# Patient Record
Sex: Male | Born: 1975 | Race: White | Hispanic: No | Marital: Married | Smoking: Never smoker
Health system: Southern US, Community
[De-identification: ages and names within clinical notes are randomized; demographics above are authoritative.]

## PROBLEM LIST (undated history)

## (undated) DIAGNOSIS — T884XXA Failed or difficult intubation, initial encounter: Secondary | ICD-10-CM

## (undated) DIAGNOSIS — I10 Essential (primary) hypertension: Secondary | ICD-10-CM

## (undated) DIAGNOSIS — T7840XA Allergy, unspecified, initial encounter: Secondary | ICD-10-CM

## (undated) DIAGNOSIS — E669 Obesity, unspecified: Secondary | ICD-10-CM

## (undated) DIAGNOSIS — K219 Gastro-esophageal reflux disease without esophagitis: Secondary | ICD-10-CM

## (undated) HISTORY — PX: NOSE SURGERY: SHX723

## (undated) HISTORY — PX: OTHER SURGICAL HISTORY: SHX169

## (undated) HISTORY — DX: Obesity, unspecified: E66.9

## (undated) HISTORY — DX: Allergy, unspecified, initial encounter: T78.40XA

## (undated) HISTORY — DX: Essential (primary) hypertension: I10

## (undated) HISTORY — DX: Gastro-esophageal reflux disease without esophagitis: K21.9

---

## 1997-07-06 ENCOUNTER — Ambulatory Visit (HOSPITAL_BASED_OUTPATIENT_CLINIC_OR_DEPARTMENT_OTHER): Admission: RE | Admit: 1997-07-06 | Discharge: 1997-07-06 | Payer: Self-pay | Admitting: Orthopedic Surgery

## 1998-10-21 ENCOUNTER — Emergency Department (HOSPITAL_COMMUNITY): Admission: EM | Admit: 1998-10-21 | Discharge: 1998-10-21 | Payer: Self-pay | Admitting: Emergency Medicine

## 1998-10-28 ENCOUNTER — Encounter: Admission: RE | Admit: 1998-10-28 | Discharge: 1998-10-28 | Payer: Self-pay | Admitting: *Deleted

## 2002-12-16 ENCOUNTER — Emergency Department (HOSPITAL_COMMUNITY): Admission: EM | Admit: 2002-12-16 | Discharge: 2002-12-16 | Payer: Self-pay | Admitting: Emergency Medicine

## 2003-03-24 ENCOUNTER — Emergency Department (HOSPITAL_COMMUNITY): Admission: EM | Admit: 2003-03-24 | Discharge: 2003-03-24 | Payer: Self-pay | Admitting: Emergency Medicine

## 2004-12-14 ENCOUNTER — Ambulatory Visit: Payer: Self-pay | Admitting: Internal Medicine

## 2005-01-21 ENCOUNTER — Emergency Department (HOSPITAL_COMMUNITY): Admission: AD | Admit: 2005-01-21 | Discharge: 2005-01-21 | Payer: Self-pay | Admitting: Family Medicine

## 2005-06-21 ENCOUNTER — Ambulatory Visit: Payer: Self-pay | Admitting: Internal Medicine

## 2005-06-23 ENCOUNTER — Emergency Department (HOSPITAL_COMMUNITY): Admission: EM | Admit: 2005-06-23 | Discharge: 2005-06-23 | Payer: Self-pay | Admitting: Emergency Medicine

## 2005-06-26 ENCOUNTER — Ambulatory Visit: Payer: Self-pay | Admitting: Internal Medicine

## 2005-12-27 ENCOUNTER — Ambulatory Visit: Payer: Self-pay | Admitting: Internal Medicine

## 2006-01-24 ENCOUNTER — Ambulatory Visit: Payer: Self-pay | Admitting: Internal Medicine

## 2006-03-01 ENCOUNTER — Ambulatory Visit: Payer: Self-pay | Admitting: Internal Medicine

## 2006-04-01 ENCOUNTER — Ambulatory Visit: Payer: Self-pay | Admitting: Internal Medicine

## 2007-03-24 DIAGNOSIS — B029 Zoster without complications: Secondary | ICD-10-CM | POA: Insufficient documentation

## 2007-03-28 ENCOUNTER — Ambulatory Visit: Payer: Self-pay | Admitting: Family Medicine

## 2007-09-09 ENCOUNTER — Ambulatory Visit: Payer: Self-pay | Admitting: Internal Medicine

## 2007-09-09 DIAGNOSIS — M542 Cervicalgia: Secondary | ICD-10-CM

## 2007-09-16 ENCOUNTER — Ambulatory Visit: Payer: Self-pay | Admitting: Internal Medicine

## 2008-01-16 ENCOUNTER — Ambulatory Visit: Payer: Self-pay | Admitting: Family Medicine

## 2008-01-16 DIAGNOSIS — J019 Acute sinusitis, unspecified: Secondary | ICD-10-CM

## 2008-02-19 ENCOUNTER — Ambulatory Visit: Payer: Self-pay | Admitting: Internal Medicine

## 2008-02-19 DIAGNOSIS — E669 Obesity, unspecified: Secondary | ICD-10-CM

## 2008-02-19 DIAGNOSIS — IMO0002 Reserved for concepts with insufficient information to code with codable children: Secondary | ICD-10-CM | POA: Insufficient documentation

## 2008-06-01 ENCOUNTER — Emergency Department (HOSPITAL_COMMUNITY): Admission: EM | Admit: 2008-06-01 | Discharge: 2008-06-01 | Payer: Self-pay | Admitting: Emergency Medicine

## 2008-06-02 ENCOUNTER — Emergency Department (HOSPITAL_COMMUNITY): Admission: EM | Admit: 2008-06-02 | Discharge: 2008-06-02 | Payer: Self-pay | Admitting: Emergency Medicine

## 2008-11-12 ENCOUNTER — Telehealth: Payer: Self-pay | Admitting: Internal Medicine

## 2009-03-23 ENCOUNTER — Ambulatory Visit (HOSPITAL_COMMUNITY): Admission: RE | Admit: 2009-03-23 | Discharge: 2009-03-23 | Payer: Self-pay | Admitting: Otolaryngology

## 2010-04-06 ENCOUNTER — Other Ambulatory Visit: Payer: Self-pay | Admitting: Internal Medicine

## 2010-05-03 LAB — BASIC METABOLIC PANEL
BUN: 15 mg/dL (ref 6–23)
CO2: 26 mEq/L (ref 19–32)
Calcium: 9.5 mg/dL (ref 8.4–10.5)
Chloride: 98 mEq/L (ref 96–112)
Creatinine, Ser: 1.05 mg/dL (ref 0.4–1.5)
GFR calc Af Amer: >60 mL/min (ref >60)
GFR calc non Af Amer: >60 mL/min (ref >60)
Glucose, Bld: 98 mg/dL (ref 70–99)
Potassium: 3.7 mEq/L (ref 3.5–5.1)
Sodium: 135 mEq/L (ref 135–145)

## 2010-05-03 LAB — URINALYSIS, ROUTINE W REFLEX MICROSCOPIC
Bilirubin Urine: NEGATIVE
Glucose, UA: NEGATIVE mg/dL
Hgb urine dipstick: NEGATIVE
Ketones, ur: NEGATIVE mg/dL
Nitrite: NEGATIVE
Protein, ur: NEGATIVE mg/dL
Specific Gravity, Urine: 1.019 (ref 1.005–1.030)
Urobilinogen, UA: 0.2 mg/dL (ref 0.0–1.0)
pH: 6 (ref 5.0–8.0)

## 2010-05-03 LAB — CBC
HCT: 44.8 % (ref 39.0–52.0)
Hemoglobin: 15.4 g/dL (ref 13.0–17.0)
MCHC: 34.5 g/dL (ref 30.0–36.0)
MCV: 85.2 fL (ref 78.0–100.0)
Platelets: 206 10*3/uL (ref 150–400)
RBC: 5.26 MIL/uL (ref 4.22–5.81)
RDW: 12.6 % (ref 11.5–15.5)
WBC: 10.2 10*3/uL (ref 4.0–10.5)

## 2010-07-20 ENCOUNTER — Other Ambulatory Visit: Payer: Self-pay | Admitting: Family Medicine

## 2010-07-20 ENCOUNTER — Other Ambulatory Visit: Payer: Self-pay

## 2010-07-20 DIAGNOSIS — R319 Hematuria, unspecified: Secondary | ICD-10-CM

## 2010-07-20 DIAGNOSIS — R109 Unspecified abdominal pain: Secondary | ICD-10-CM

## 2010-07-21 ENCOUNTER — Other Ambulatory Visit: Payer: Self-pay

## 2010-08-04 ENCOUNTER — Ambulatory Visit (INDEPENDENT_AMBULATORY_CARE_PROVIDER_SITE_OTHER): Payer: Managed Care, Other (non HMO) | Admitting: Family Medicine

## 2010-08-04 ENCOUNTER — Encounter: Payer: Self-pay | Admitting: Family Medicine

## 2010-08-04 VITALS — BP 130/80 | HR 60 | Temp 99.2°F | Wt 350.0 lb

## 2010-08-04 DIAGNOSIS — J029 Acute pharyngitis, unspecified: Secondary | ICD-10-CM

## 2010-08-04 LAB — POCT RAPID STREP A (OFFICE): Rapid Strep A Screen: NEGATIVE

## 2010-08-04 MED ORDER — BENAZEPRIL HCL 20 MG PO TABS: 20.0000 mg | ORAL_TABLET | Freq: Every day | ORAL | Status: DC

## 2010-08-04 MED ORDER — CHLORTHALIDONE 25 MG PO TABS: 25.0000 mg | ORAL_TABLET | Freq: Every day | ORAL | Status: DC

## 2010-08-04 NOTE — Progress Notes (Signed)
  Subjective:    Patient ID: Justin White, male    DOB: 10/26/1975, 35 y.o.   MRN: [id number]  HPI Here for 3 days of body aches, ST, and fevers to 101 degrees. No cough or HA. No NVD. On fluids and Tylenol.    Review of Systems  Constitutional: Positive for fever.  HENT: Positive for sore throat. Negative for congestion, postnasal drip and sinus pressure.   Eyes: Negative.   Respiratory: Negative.   Gastrointestinal: Negative.   Genitourinary: Negative.        Objective:   Physical Exam  Constitutional: He appears well-developed and well-nourished.  HENT:  Right Ear: External ear normal.  Left Ear: External ear normal.  Nose: Nose normal.  Mouth/Throat: Oropharynx is clear and moist. No oropharyngeal exudate.  Eyes: Conjunctivae are normal. Pupils are equal, round, and reactive to light.  Neck: Normal range of motion. Neck supple. No thyromegaly present.  Pulmonary/Chest: Effort normal and breath sounds normal. No respiratory distress. He has no wheezes. He has no rales. He exhibits no tenderness.  Lymphadenopathy:    He has no cervical adenopathy.          Assessment & Plan:  This is a viral infection. Rest, drink fluids, use Advil prn .

## 2011-01-06 ENCOUNTER — Encounter (HOSPITAL_COMMUNITY): Payer: Self-pay | Admitting: Emergency Medicine

## 2011-01-06 ENCOUNTER — Emergency Department (HOSPITAL_COMMUNITY): Payer: Managed Care, Other (non HMO)

## 2011-01-06 ENCOUNTER — Emergency Department (HOSPITAL_COMMUNITY)
Admission: EM | Admit: 2011-01-06 | Discharge: 2011-01-06 | Disposition: A | Discharge status: Home / Self Care | Payer: Managed Care, Other (non HMO) | Attending: Emergency Medicine | Admitting: Emergency Medicine

## 2011-01-06 DIAGNOSIS — R070 Pain in throat: Secondary | ICD-10-CM | POA: Insufficient documentation

## 2011-01-06 DIAGNOSIS — R51 Headache: Secondary | ICD-10-CM | POA: Insufficient documentation

## 2011-01-06 DIAGNOSIS — R112 Nausea with vomiting, unspecified: Secondary | ICD-10-CM | POA: Insufficient documentation

## 2011-01-06 DIAGNOSIS — I10 Essential (primary) hypertension: Secondary | ICD-10-CM | POA: Insufficient documentation

## 2011-01-06 DIAGNOSIS — R059 Cough, unspecified: Secondary | ICD-10-CM | POA: Insufficient documentation

## 2011-01-06 DIAGNOSIS — R6883 Chills (without fever): Secondary | ICD-10-CM | POA: Insufficient documentation

## 2011-01-06 DIAGNOSIS — Z79899 Other long term (current) drug therapy: Secondary | ICD-10-CM | POA: Insufficient documentation

## 2011-01-06 DIAGNOSIS — IMO0001 Reserved for inherently not codable concepts without codable children: Secondary | ICD-10-CM | POA: Insufficient documentation

## 2011-01-06 DIAGNOSIS — R000 Tachycardia, unspecified: Secondary | ICD-10-CM | POA: Insufficient documentation

## 2011-01-06 DIAGNOSIS — J189 Pneumonia, unspecified organism: Secondary | ICD-10-CM | POA: Insufficient documentation

## 2011-01-06 DIAGNOSIS — R05 Cough: Secondary | ICD-10-CM | POA: Insufficient documentation

## 2011-01-06 DIAGNOSIS — R0602 Shortness of breath: Secondary | ICD-10-CM | POA: Insufficient documentation

## 2011-01-06 MED ORDER — MOXIFLOXACIN HCL 400 MG PO TABS: 400.0000 mg | ORAL_TABLET | Freq: Every day | ORAL | Status: DC

## 2011-01-06 MED ORDER — LIDOCAINE HCL (PF) 1 % IJ SOLN
INTRAMUSCULAR | Status: AC
  Administered 2011-01-06: 5 mL via INTRAMUSCULAR
  Filled 2011-01-06: qty 5

## 2011-01-06 MED ORDER — ONDANSETRON 4 MG PO TBDP
4.0000 mg | ORAL_TABLET | Freq: Once | ORAL | Status: AC
  Administered 2011-01-06: 4 mg via ORAL
  Filled 2011-01-06: qty 1

## 2011-01-06 MED ORDER — IPRATROPIUM BROMIDE 0.02 % IN SOLN
0.5000 mg | Freq: Once | RESPIRATORY_TRACT | Status: AC
  Administered 2011-01-06: 0.5 mg via RESPIRATORY_TRACT
  Filled 2011-01-06: qty 2.5

## 2011-01-06 MED ORDER — PROMETHAZINE-DM 6.25-15 MG/5ML PO SYRP: 5.0000 mL | ORAL_SOLUTION | Freq: Four times a day (QID) | ORAL | Status: AC | PRN

## 2011-01-06 MED ORDER — ALBUTEROL SULFATE (5 MG/ML) 0.5% IN NEBU
2.5000 mg | INHALATION_SOLUTION | Freq: Once | RESPIRATORY_TRACT | Status: AC
  Administered 2011-01-06: 2.5 mg via RESPIRATORY_TRACT
  Filled 2011-01-06 (×2): qty 0.5

## 2011-01-06 MED ORDER — CEFTRIAXONE SODIUM 1 G IJ SOLR
1.0000 g | Freq: Once | INTRAMUSCULAR | Status: AC
  Administered 2011-01-06: 1 g via INTRAMUSCULAR
  Filled 2011-01-06: qty 10

## 2011-01-06 MED ORDER — MOXIFLOXACIN HCL 400 MG PO TABS
400.0000 mg | ORAL_TABLET | Freq: Once | ORAL | Status: AC
  Administered 2011-01-06: 400 mg via ORAL
  Filled 2011-01-06: qty 1

## 2011-01-06 MED ORDER — ALBUTEROL SULFATE HFA 108 (90 BASE) MCG/ACT IN AERS: 1.0000 | INHALATION_SPRAY | Freq: Four times a day (QID) | RESPIRATORY_TRACT | Status: DC | PRN

## 2011-01-06 MED ORDER — PROMETHAZINE-CODEINE 6.25-10 MG/5ML PO SYRP
5.0000 mL | ORAL_SOLUTION | Freq: Once | ORAL | Status: AC
  Administered 2011-01-06: 5 mL via ORAL
  Filled 2011-01-06: qty 5

## 2011-01-06 NOTE — ED Provider Notes (Addendum)
History     CSN: [id number] Arrival date & time: 01/06/2011  3:14 AM   First MD Initiated Contact with Patient 01/06/11 [phone]      Chief Complaint  Patient presents with  . Cough    (Consider location/radiation/quality/duration/timing/severity/associated sxs/prior treatment) HPI Comments: Gradual onset of coughing, body aches, sore throat, headache.  Today, or tonight developed, nausea and vomiting.  No diarrhea  Patient is a 35 y.o. male presenting with cough. The history is provided by the patient.  Cough This is a new problem. The current episode started 2 days ago. The problem occurs every few minutes. The problem has been gradually worsening. The cough is non-productive. The maximum temperature recorded prior to his arrival was 100 to 100.9 F. Associated symptoms include chills, sore throat and shortness of breath. Pertinent negatives include no chest pain, no rhinorrhea and no wheezing. He has tried nothing for the symptoms. He is not a smoker.    Past Medical History  Diagnosis Date  . Hypertension   . Allergy   . Chickenpox   . Obesity   . Obesity     Past Surgical History  Procedure Date  . Left knee arthroscopy   . Left shoulder arthroscopy   . Nose surgery     Family History  Problem Relation Age of Onset  . Hypertension      History  Substance Use Topics  . Smoking status: Never Smoker   . Smokeless tobacco: Not on file  . Alcohol Use: No      Review of Systems  Constitutional: Positive for chills.  HENT: Positive for sore throat. Negative for rhinorrhea.   Respiratory: Positive for cough and shortness of breath. Negative for wheezing.   Cardiovascular: Negative for chest pain.  Gastrointestinal: Positive for nausea and vomiting. Negative for diarrhea.  Genitourinary: Negative.   Musculoskeletal: Negative.   Skin: Negative.   Neurological: Negative.   Hematological: Negative.   Psychiatric/Behavioral: Negative.     Allergies  Review of  patient's allergies indicates no known allergies.  Home Medications   Current Outpatient Rx  Name Route Sig Dispense Refill  . ALBUTEROL SULFATE HFA 108 (90 BASE) MCG/ACT IN AERS Inhalation Inhale 1-2 puffs into the lungs every 6 (six) hours as needed for wheezing. 1 Inhaler 0  . MOXIFLOXACIN HCL 400 MG PO TABS Oral Take 1 tablet (400 mg total) by mouth daily. 7 tablet 0  . PROMETHAZINE-DM 6.25-15 MG/5ML PO SYRP Oral Take 5 mLs by mouth 4 (four) times daily as needed for cough. 118 mL 0    BP 131/94  Pulse 111  Temp 98.2 F (36.8 C)  Resp 18  SpO2 96%  Physical Exam  Constitutional: He is oriented to person, place, and time. He appears well-developed and well-nourished.  HENT:  Head: Normocephalic.  Eyes: EOM are normal.  Neck: Neck supple.  Cardiovascular: Tachycardia present.   Pulmonary/Chest: Breath sounds normal. He has no wheezes.  Musculoskeletal: Normal range of motion.  Neurological: He is oriented to person, place, and time.  Skin: Skin is warm.  Psychiatric: He has a normal mood and affect.    ED Course  Procedures (including critical care time)  Labs Reviewed - No data to display Dg Chest 2 View  01/06/2011  *RADIOLOGY REPORT*  Clinical Data: Shortness of breath and painful cough; body aches.  CHEST - 2 VIEW  Comparison: Chest radiograph 03/17/2009  Findings: The lungs are relatively well-aerated.  Mild focal right midlung and left basilar airspace opacities raise  concern for multifocal pneumonia.  There is no evidence of pleural effusion or pneumothorax.  The heart is normal in size; the mediastinal contour is within normal limits.  No acute osseous abnormalities are seen.  IMPRESSION: Mild focal right midlung and left basilar airspace opacities raise concern for multifocal pneumonia.  Original Report Authenticated By: Tonia Ghent, M.D.     1. Pneumonia   6:43 AM With Xray showing pneumonia will give IM Rocephin and start on PO Avolox   MDM   pneumonia bronchitis        Arman Filter, NP 01/06/11 0431  Arman Filter, NP 01/06/11 0433  Arman Filter, NP 01/06/11 4540  Arman Filter, NP 01/06/11 9811  Arman Filter, NP 01/06/11 2032

## 2011-01-06 NOTE — ED Provider Notes (Signed)
Medical screening examination/treatment/procedure(s) were performed by non-physician practitioner and as supervising physician I was immediately available for consultation/collaboration.   Hanley Seamen, MD 01/06/11 [phone]

## 2011-01-06 NOTE — ED Notes (Signed)
PT STATED THAT HE COULD "BREATHE BETTER" AFTER NEB TX. DONE.

## 2011-01-06 NOTE — ED Notes (Signed)
PT UP W/O COUGHING OR SOB. NAD WIFE TO DRIVE.

## 2011-01-06 NOTE — ED Notes (Signed)
NO S/O ALLERGIC REACTION AFTER INJECTION. AWAITING DISPO. SITTING ON BS IN NRD.

## 2011-01-06 NOTE — ED Notes (Signed)
PT. REPORTS PERSISTENT DRY COUGH WITH BODY ACHES/PAIN AND LOW GRADE FEVER AND VOMITTING.

## 2011-01-08 ENCOUNTER — Encounter: Payer: Self-pay | Admitting: Internal Medicine

## 2011-01-08 ENCOUNTER — Ambulatory Visit (INDEPENDENT_AMBULATORY_CARE_PROVIDER_SITE_OTHER): Payer: Managed Care, Other (non HMO) | Admitting: Internal Medicine

## 2011-01-08 DIAGNOSIS — IMO0002 Reserved for concepts with insufficient information to code with codable children: Secondary | ICD-10-CM

## 2011-01-08 DIAGNOSIS — E669 Obesity, unspecified: Secondary | ICD-10-CM

## 2011-01-08 MED ORDER — CHLORTHALIDONE 25 MG PO TABS: 25.0000 mg | ORAL_TABLET | Freq: Every day | ORAL | Status: DC

## 2011-01-08 MED ORDER — BENAZEPRIL HCL 20 MG PO TABS: 20.0000 mg | ORAL_TABLET | Freq: Every day | ORAL | Status: DC

## 2011-01-08 NOTE — Patient Instructions (Signed)
Limit your sodium (Salt) intake  Please check your blood pressure on a regular basis.  If it is consistently greater than 150/90, please make an office appointment.    It is important that you exercise regularly, at least 20 minutes 3 to 4 times per week.  If you develop chest pain or shortness of breath seek  medical attention.  You need to lose weight.  Consider a lower calorie diet and regular exercise.  Take your antibiotic as prescribed until ALL of it is gone, but stop if you develop a rash, swelling, or any side effects of the medication.  Contact our office as soon as possible if  there are side effects of the medication.

## 2011-01-08 NOTE — Progress Notes (Signed)
  Subjective:    Patient ID: Justin White, male    DOB: 09/18/1975, 35 y.o.   MRN: [id number]  HPI  35 year old patient who is seen today for followup. He developed the cough fever and chills and was seen in the ED 3 days ago. Chest x-ray was worrisome for multilobar pneumonia. He is completing Avelox and has done much better. The fever and chills have resolved and the cough has improved. He has been out of work since this time. He has a long history of hypertension which has been controlled on Lotensin and chlorthalidone. He has been out of his medications for 2 weeks. Blood pressure is elevated in the ED and blood pressure on arrival here 140/80. Hospital records reviewed Chest x-ray reviewed    Review of Systems  Constitutional: Positive for fever, chills, diaphoresis, activity change, appetite change and fatigue.  HENT: Negative for hearing loss, ear pain, congestion, sore throat, trouble swallowing, neck stiffness, dental problem, voice change and tinnitus.   Eyes: Negative for pain, discharge and visual disturbance.  Respiratory: Positive for cough. Negative for chest tightness, wheezing and stridor.   Cardiovascular: Negative for chest pain, palpitations and leg swelling.  Gastrointestinal: Negative for nausea, vomiting, abdominal pain, diarrhea, constipation, blood in stool and abdominal distention.  Genitourinary: Negative for urgency, hematuria, flank pain, discharge, difficulty urinating and genital sores.  Musculoskeletal: Negative for myalgias, back pain, joint swelling, arthralgias and gait problem.  Skin: Negative for rash.  Neurological: Negative for dizziness, syncope, speech difficulty, weakness, numbness and headaches.  Hematological: Negative for adenopathy. Does not bruise/bleed easily.  Psychiatric/Behavioral: Negative for behavioral problems and dysphoric mood. The patient is not nervous/anxious.        Objective:   Physical Exam  Constitutional: He is oriented to  person, place, and time. He appears well-developed.       Weight in excess of 350 Blood pressure 120/80   HENT:  Head: Normocephalic.  Right Ear: External ear normal.  Left Ear: External ear normal.  Eyes: Conjunctivae and EOM are normal.  Neck: Normal range of motion.  Cardiovascular: Normal rate and normal heart sounds.   Pulmonary/Chest: Effort normal and breath sounds normal.       Rare scattered rhonchi. O2 saturation 97%  Abdominal: Bowel sounds are normal.  Musculoskeletal: Normal range of motion. He exhibits no edema and no tenderness.  Neurological: He is alert and oriented to person, place, and time.  Psychiatric: He has a normal mood and affect. His behavior is normal.          Assessment & Plan:   Resolving community acquired pneumonia. We'll complete 7 days of Avelox Hypertension. We'll resume blood pressure medications Exogenous obesity. Weight loss encouraged

## 2011-01-15 ENCOUNTER — Ambulatory Visit (INDEPENDENT_AMBULATORY_CARE_PROVIDER_SITE_OTHER): Payer: Managed Care, Other (non HMO) | Admitting: Internal Medicine

## 2011-01-15 ENCOUNTER — Encounter: Payer: Self-pay | Admitting: Internal Medicine

## 2011-01-15 ENCOUNTER — Ambulatory Visit (INDEPENDENT_AMBULATORY_CARE_PROVIDER_SITE_OTHER)
Admission: RE | Admit: 2011-01-15 | Discharge: 2011-01-15 | Disposition: A | Discharge status: Home / Self Care | Payer: Managed Care, Other (non HMO) | Source: Ambulatory Visit | Attending: Internal Medicine | Admitting: Internal Medicine

## 2011-01-15 DIAGNOSIS — J189 Pneumonia, unspecified organism: Secondary | ICD-10-CM

## 2011-01-15 DIAGNOSIS — IMO0002 Reserved for concepts with insufficient information to code with codable children: Secondary | ICD-10-CM

## 2011-01-15 MED ORDER — HYDROCODONE-HOMATROPINE 5-1.5 MG/5ML PO SYRP: 5.0000 mL | ORAL_SOLUTION | Freq: Four times a day (QID) | ORAL | Status: DC | PRN

## 2011-01-15 MED ORDER — AZITHROMYCIN 250 MG PO TABS: ORAL_TABLET | ORAL | Status: AC

## 2011-01-15 NOTE — Progress Notes (Signed)
  Subjective:    Patient ID: Justin White, male    DOB: 1975-09-22, 35 y.o.   MRN: [id number]  HPI  35 year old patient who has a history of obesity and treated hypertension. He was seen in the ED on November 24 and was treated for pneumonia with Avelox. He was seen here 2 days later. He still has considerable dyspnea on exertion as well as refractory nonproductive cough. He attempted to return to work today but was sent home due to the incessant cough he has had no more fever or chills and his cough is now nonproductive. He remains weak.    Review of Systems  Constitutional: Positive for fatigue. Negative for fever, chills and appetite change.  HENT: Negative for hearing loss, ear pain, congestion, sore throat, trouble swallowing, neck stiffness, dental problem, voice change and tinnitus.   Eyes: Negative for pain, discharge and visual disturbance.  Respiratory: Positive for cough (severe nonproductive cough) and shortness of breath (short of breath with minimal exertion). Negative for chest tightness, wheezing and stridor.   Cardiovascular: Negative for chest pain, palpitations and leg swelling.  Gastrointestinal: Negative for nausea, vomiting, abdominal pain, diarrhea, constipation, blood in stool and abdominal distention.  Genitourinary: Negative for urgency, hematuria, flank pain, discharge, difficulty urinating and genital sores.  Musculoskeletal: Negative for myalgias, back pain, joint swelling, arthralgias and gait problem.  Skin: Negative for rash.  Neurological: Negative for dizziness, syncope, speech difficulty, weakness, numbness and headaches.  Hematological: Negative for adenopathy. Does not bruise/bleed easily.  Psychiatric/Behavioral: Negative for behavioral problems and dysphoric mood. The patient is not nervous/anxious.        Objective:   Physical Exam  Constitutional: He is oriented to person, place, and time. He appears well-developed.       Obese The pressure 130/82   HENT:  Head: Normocephalic.  Right Ear: External ear normal.  Left Ear: External ear normal.  Eyes: Conjunctivae and EOM are normal.  Neck: Normal range of motion.  Cardiovascular: Normal rate and normal heart sounds.   Pulmonary/Chest:       Extensive rales on his left lower lung field O2 saturation 98 at rest and 97 after a walk up and down the hall Pulse rate increased from 75-102  Frequent paroxysms of coughing  Abdominal: Bowel sounds are normal.  Musculoskeletal: Normal range of motion. He exhibits no edema and no tenderness.  Neurological: He is alert and oriented to person, place, and time.  Psychiatric: He has a normal mood and affect. His behavior is normal.          Assessment & Plan:   Resolving pneumonia. Will retreat with  azithromycin and check a followup chest x-ray. We'll treat his cough symptomatically and add Mucinex. Return in 7 days for followup and hopefully we'll be able to return to work at that

## 2011-01-15 NOTE — Patient Instructions (Signed)
Drink as much fluid as you  can tolerate over the next few days  Mucinex one twice daily  Cough medicine as directed  . Return in 7 days

## 2011-01-22 ENCOUNTER — Ambulatory Visit (INDEPENDENT_AMBULATORY_CARE_PROVIDER_SITE_OTHER): Payer: Managed Care, Other (non HMO) | Admitting: Internal Medicine

## 2011-01-22 ENCOUNTER — Encounter: Payer: Self-pay | Admitting: Internal Medicine

## 2011-01-22 DIAGNOSIS — IMO0002 Reserved for concepts with insufficient information to code with codable children: Secondary | ICD-10-CM

## 2011-01-22 MED ORDER — HYDROCODONE-HOMATROPINE 5-1.5 MG/5ML PO SYRP: 5.0000 mL | ORAL_SOLUTION | Freq: Four times a day (QID) | ORAL | Status: AC | PRN

## 2011-01-22 NOTE — Progress Notes (Signed)
  Subjective:    Patient ID: Justin White, male    DOB: 1975/12/24, 35 y.o.   MRN: [id number]  HPI  35 year old patient who is seen today for followup. He was seen 7 days ago for followup of a community-acquired pneumonia he had completed the Avelox therapy in 7 days ago was retreated with azithromycin. Clinical exam revealed persistent rales involving the left lower lung field. A follow chest x-ray revealed resolution of his pneumonia. Denies any fever or chills but he has a persistent refractory incessant cough. Cough is nonproductive. He has been unable to work due to the cough and general debility. He has treated hypertension which has been stable.  He requires FMLA form completion    Review of Systems  Constitutional: Positive for fatigue. Negative for fever, chills and appetite change.  HENT: Negative for hearing loss, ear pain, congestion, sore throat, trouble swallowing, neck stiffness, dental problem, voice change and tinnitus.   Eyes: Negative for pain, discharge and visual disturbance.  Respiratory: Positive for cough, choking and wheezing. Negative for chest tightness and stridor.   Cardiovascular: Negative for chest pain, palpitations and leg swelling.  Gastrointestinal: Negative for nausea, vomiting, abdominal pain, diarrhea, constipation, blood in stool and abdominal distention.  Genitourinary: Negative for urgency, hematuria, flank pain, discharge, difficulty urinating and genital sores.  Musculoskeletal: Negative for myalgias, back pain, joint swelling, arthralgias and gait problem.  Skin: Negative for rash.  Neurological: Negative for dizziness, syncope, speech difficulty, weakness, numbness and headaches.  Hematological: Negative for adenopathy. Does not bruise/bleed easily.  Psychiatric/Behavioral: Negative for behavioral problems and dysphoric mood. The patient is not nervous/anxious.        Objective:   Physical Exam  Constitutional: He is oriented to person, place,  and time. He appears well-developed.       No distress. Afebrile. Frequent paroxysms of coughing  HENT:  Head: Normocephalic.  Right Ear: External ear normal.  Left Ear: External ear normal.  Eyes: Conjunctivae and EOM are normal.  Neck: Normal range of motion.  Cardiovascular: Normal rate and normal heart sounds.   Pulmonary/Chest: Effort normal. No respiratory distress. He has no wheezes. He has rales.       Scattered rales while the left lower lung field Improved compared to one week ago O2 saturation 98%  Abdominal: Bowel sounds are normal.  Musculoskeletal: Normal range of motion. He exhibits no edema and no tenderness.  Neurological: He is alert and oriented to person, place, and time.  Psychiatric: He has a normal mood and affect. His behavior is normal.          Assessment & Plan:   Slowly resolving community acquired pneumonia with refractory cough. He will continue antitussives and will be excused from work for another week. We'll continue hydrocodone-based cough medicine and Mucinex DM added to his regimen. He'll continue on inhalational albuterol. Hopefully,  will be able to return to work in 7 days

## 2011-01-22 NOTE — Patient Instructions (Addendum)
Hycodan 1 teaspoon every 12 hours  Get plenty of rest, Drink lots of  clear liquids, and use Tylenol or ibuprofen for fever and discomfort.    Call or return to clinic prn if these symptoms worsen or fail to improve as anticipated.  Mucinex  DM twice daily

## 2011-01-23 ENCOUNTER — Telehealth: Payer: Self-pay

## 2011-01-23 NOTE — Telephone Encounter (Signed)
Pt was seen on yesterday.  A note for work for yesterday and today.  Pt states a note to return to work on Dec. 17th is needed as well. Pls advise.

## 2011-01-23 NOTE — Telephone Encounter (Signed)
Spoke with pt- letter will be ready for pick up tomorrow

## 2011-07-11 ENCOUNTER — Ambulatory Visit: Payer: Managed Care, Other (non HMO) | Admitting: Internal Medicine

## 2011-08-13 ENCOUNTER — Encounter: Payer: Self-pay | Admitting: Internal Medicine

## 2011-08-13 ENCOUNTER — Telehealth: Payer: Self-pay | Admitting: Internal Medicine

## 2011-08-13 ENCOUNTER — Ambulatory Visit (INDEPENDENT_AMBULATORY_CARE_PROVIDER_SITE_OTHER): Payer: Managed Care, Other (non HMO) | Admitting: Internal Medicine

## 2011-08-13 VITALS — BP 156/94 | Temp 98.4°F | Wt 365.0 lb

## 2011-08-13 DIAGNOSIS — H109 Unspecified conjunctivitis: Secondary | ICD-10-CM | POA: Insufficient documentation

## 2011-08-13 MED ORDER — TOBRAMYCIN-DEXAMETHASONE 0.3-0.1 % OP OINT: TOPICAL_OINTMENT | Freq: Three times a day (TID) | OPHTHALMIC | Status: AC

## 2011-08-13 MED ORDER — NEOMYCIN-BACITRACIN ZN-POLYMYX 5-400-10000 OP OINT: TOPICAL_OINTMENT | Freq: Four times a day (QID) | OPHTHALMIC | Status: DC

## 2011-08-13 NOTE — Patient Instructions (Addendum)
Use zyrtec 10 mg once daily over the counter Use ranitidine 150 mg twice daily over the counter Please call our office if your symptoms do not improve or gets worse.

## 2011-08-13 NOTE — Telephone Encounter (Signed)
Caller: Justin White/Patient; PCP: Eleonore Chiquito; CB#: [phone];  Call regarding Eye Discharge to left eye; sx started 08/11/11; white of eyeball is red and itching; yellow discharge noted; no pain; little redness below the eye;  pt states that he had some Polymoxin eye drops that he started but they are not helping; pt is currently on PCN from having a tooth removed; Triaged per Eye: Infection or Irritation Guideline; See in 24 hr d/t new onset of eye redness, irritation with yellow drainage;  offered appt for tomorrow but pt would like to be seen today; appt made for 3:30pm today per office staff with Dr Artist Pais; pt will comply

## 2011-08-13 NOTE — Progress Notes (Signed)
  Subjective:    Patient ID: Justin White, male    DOB: 26-Feb-1975, 36 y.o.   MRN: [id number]  HPI  36 year old white male with hypertension and obesity complains of left eye redness for 2 days. His symptoms started on Friday night. He noticed his left eye was pleuritic and tearing profusely. Sunday morning he woke up to eye discharge. He denies any change in vision or eye pain. On Sunday he notes left eye was completely red but today redness has improved.  Review of Systems Negative for eye pain or changes in vision No fever He is currently on PCN after tooth extraction  Past Medical History  Diagnosis Date  . Hypertension   . Allergy   . Chickenpox   . Obesity   . Obesity     History   Social History  . Marital Status: Married    Spouse Name: N/A    Number of Children: N/A  . Years of Education: N/A   Occupational History  . Not on file.   Social History Main Topics  . Smoking status: Never Smoker   . Smokeless tobacco: Never Used  . Alcohol Use: No  . Drug Use: No  . Sexually Active: Not on file   Other Topics Concern  . Not on file   Social History Narrative   MarriedOccupation: [place] [employer]    Past Surgical History  Procedure Date  . Left knee arthroscopy   . Left shoulder arthroscopy   . Nose surgery     Family History  Problem Relation Age of Onset  . Hypertension      No Known Allergies  Current Outpatient Prescriptions on File Prior to Visit  Medication Sig Dispense Refill  . benazepril (LOTENSIN) 20 MG tablet Take 1 tablet (20 mg total) by mouth daily.  90 tablet  4  . chlorthalidone (HYGROTON) 25 MG tablet Take 1 tablet (25 mg total) by mouth daily.  90 tablet  4    BP 156/94  Temp 98.4 F (36.9 C) (Oral)  Wt 365 lb (165.563 kg)       Objective:   Physical Exam  Constitutional: He appears well-developed and well-nourished.  HENT:  Head: Normocephalic and atraumatic.  Right Ear: External ear normal.  Left Ear: External  ear normal.  Eyes: Pupils are equal, round, and reactive to light.       Left conjunctival injection No obvious foreign body Left lower eyelid slightly erythematous and swollen  Cardiovascular: Normal rate, regular rhythm and normal heart sounds.   Pulmonary/Chest: Breath sounds normal. He has no wheezes.      Assessment & Plan:

## 2011-08-13 NOTE — Assessment & Plan Note (Signed)
36 year old with conjunctivitis of left eye. Treat with dexamethasone and tobramycin ointment 4 times a day. Patient also to use eye patch for 3-5 days.  Zyrtec and ranitidine for pruritic symptoms.  Patient advised to call office if symptoms persist or worsen.

## 2012-03-07 ENCOUNTER — Encounter (HOSPITAL_COMMUNITY): Payer: Self-pay | Admitting: *Deleted

## 2012-03-07 ENCOUNTER — Emergency Department (HOSPITAL_COMMUNITY)
Admission: EM | Admit: 2012-03-07 | Discharge: 2012-03-07 | Disposition: A | Discharge status: Home / Self Care | Payer: Managed Care, Other (non HMO) | Attending: Emergency Medicine | Admitting: Emergency Medicine

## 2012-03-07 DIAGNOSIS — R1013 Epigastric pain: Secondary | ICD-10-CM | POA: Insufficient documentation

## 2012-03-07 DIAGNOSIS — R109 Unspecified abdominal pain: Secondary | ICD-10-CM

## 2012-03-07 DIAGNOSIS — R112 Nausea with vomiting, unspecified: Secondary | ICD-10-CM | POA: Insufficient documentation

## 2012-03-07 DIAGNOSIS — Z8619 Personal history of other infectious and parasitic diseases: Secondary | ICD-10-CM | POA: Insufficient documentation

## 2012-03-07 DIAGNOSIS — I10 Essential (primary) hypertension: Secondary | ICD-10-CM | POA: Insufficient documentation

## 2012-03-07 DIAGNOSIS — E669 Obesity, unspecified: Secondary | ICD-10-CM | POA: Insufficient documentation

## 2012-03-07 LAB — URINALYSIS, ROUTINE W REFLEX MICROSCOPIC
Nitrite: NEGATIVE
Specific Gravity, Urine: 1.024 (ref 1.005–1.030)
Urobilinogen, UA: 0.2 mg/dL (ref 0.0–1.0)

## 2012-03-07 LAB — COMPREHENSIVE METABOLIC PANEL
ALT: 26 U/L (ref 0–53)
AST: 22 U/L (ref 0–37)
Alkaline Phosphatase: 99 U/L (ref 39–117)
CO2: 24 mEq/L (ref 19–32)
GFR calc Af Amer: >90 mL/min (ref >90)
Glucose, Bld: 98 mg/dL (ref 70–99)
Potassium: 4 mEq/L (ref 3.5–5.1)
Sodium: 140 mEq/L (ref 135–145)
Total Protein: 8 g/dL (ref 6.0–8.3)

## 2012-03-07 LAB — CBC WITH DIFFERENTIAL/PLATELET
Basophils Absolute: 0 10*3/uL (ref 0.0–0.1)
Eosinophils Absolute: 0.2 10*3/uL (ref 0.0–0.7)
Lymphocytes Relative: 21 % (ref 12–46)
Lymphs Abs: 2.5 10*3/uL (ref 0.7–4.0)
Neutrophils Relative %: 70 % (ref 43–77)
Platelets: 201 10*3/uL (ref 150–400)
RBC: 5.32 MIL/uL (ref 4.22–5.81)
RDW: 13.3 % (ref 11.5–15.5)
WBC: 11.6 10*3/uL — ABNORMAL HIGH (ref 4.0–10.5)

## 2012-03-07 MED ORDER — ONDANSETRON HCL 4 MG/2ML IJ SOLN
4.0000 mg | Freq: Once | INTRAMUSCULAR | Status: AC
  Administered 2012-03-07: 4 mg via INTRAVENOUS
  Filled 2012-03-07: qty 2

## 2012-03-07 MED ORDER — GI COCKTAIL ~~LOC~~
30.0000 mL | Freq: Once | ORAL | Status: AC
  Administered 2012-03-07: 30 mL via ORAL
  Filled 2012-03-07: qty 30

## 2012-03-07 MED ORDER — SODIUM CHLORIDE 0.9 % IV BOLUS (SEPSIS)
1000.0000 mL | Freq: Once | INTRAVENOUS | Status: AC
  Administered 2012-03-07: 1000 mL via INTRAVENOUS

## 2012-03-07 MED ORDER — SUCRALFATE 1 GM/10ML PO SUSP: 1.0000 g | Freq: Four times a day (QID) | ORAL | Status: DC | PRN

## 2012-03-07 MED ORDER — HYDROMORPHONE HCL PF 1 MG/ML IJ SOLN
0.5000 mg | INTRAMUSCULAR | Status: DC | PRN
  Administered 2012-03-07: 0.5 mg via INTRAVENOUS
  Filled 2012-03-07: qty 1

## 2012-03-07 MED ORDER — HYDROMORPHONE HCL PF 1 MG/ML IJ SOLN
1.0000 mg | Freq: Once | INTRAMUSCULAR | Status: AC
  Administered 2012-03-07: 1 mg via INTRAVENOUS
  Filled 2012-03-07: qty 1

## 2012-03-07 MED ORDER — OMEPRAZOLE 20 MG PO CPDR: 20.0000 mg | DELAYED_RELEASE_CAPSULE | Freq: Every day | ORAL | Status: DC

## 2012-03-07 NOTE — ED Provider Notes (Signed)
History     CSN: [id number]  Arrival date & time 03/07/12  8295   First MD Initiated Contact with Patient 03/07/12 1939      Chief Complaint  Patient presents with  . Abdominal Pain    (Consider location/radiation/quality/duration/timing/severity/associated sxs/prior treatment) HPI The patient presents after the subacute onset of abdominal pain.  The pain began approximately 8 hours prior to arrival.  Since onset has been severe pain in the patient's epigastrium, nonradiating, minimally improved with TUMS.  There is associated nausea, and multiple episodes of emesis.  No diarrhea, or bowel movement changes. No fever, no chills, no chest pain, no dyspnea, no lightheadedness, no syncope. The patient states that he is one similar prior episode in the distant past, though not as severe. The patient also notes a history of kidney stones.   Past Medical History  Diagnosis Date  . Hypertension   . Allergy   . Chickenpox   . Obesity   . Obesity     Past Surgical History  Procedure Date  . Left knee arthroscopy   . Left shoulder arthroscopy   . Nose surgery     Family History  Problem Relation Age of Onset  . Hypertension      History  Substance Use Topics  . Smoking status: Never Smoker   . Smokeless tobacco: Never Used  . Alcohol Use: No      Review of Systems  Constitutional:       Per HPI, otherwise negative  HENT:       Per HPI, otherwise negative  Eyes: Negative.   Respiratory:       Per HPI, otherwise negative  Cardiovascular:       Per HPI, otherwise negative  Gastrointestinal: Positive for nausea and vomiting.  Genitourinary: Negative.   Musculoskeletal:       Per HPI, otherwise negative  Skin: Negative.   Neurological: Negative for syncope.    Allergies  Review of patient's allergies indicates no known allergies.  Home Medications   Current Outpatient Rx  Name  Route  Sig  Dispense  Refill  . ACETAMINOPHEN 500 MG PO TABS   Oral   Take  500 mg by mouth every 6 (six) hours as needed. For pain         . ALKA-SELTZER PLUS COLD/SINUS PO   Oral   Take 2 capsules by mouth every 4 (four) hours as needed. For cold/cough           BP 166/76  Pulse 67  Temp 97.5 F (36.4 C) (Oral)  Resp 22  SpO2 100%  Physical Exam  Nursing note and vitals reviewed. Constitutional: He is oriented to person, place, and time. He appears well-developed. No distress.  HENT:  Head: Normocephalic and atraumatic.  Eyes: Conjunctivae normal and EOM are normal.  Cardiovascular: Normal rate and regular rhythm.   Pulmonary/Chest: Effort normal. No stridor. No respiratory distress.  Abdominal: He exhibits no distension. There is no hepatosplenomegaly. There is tenderness in the epigastric area. There is guarding. There is no rigidity and no rebound.  Musculoskeletal: He exhibits no edema.  Neurological: He is alert and oriented to person, place, and time.  Skin: Skin is warm and dry.  Psychiatric: He has a normal mood and affect.    ED Course  Procedures (including critical care time)  Labs Reviewed  CBC WITH DIFFERENTIAL - Abnormal; Notable for the following:    WBC 11.6 (*)     Neutro Abs 8.2 (*)  All other components within normal limits  URINALYSIS, ROUTINE W REFLEX MICROSCOPIC  COMPREHENSIVE METABOLIC PANEL  LIPASE, BLOOD   No results found.   No diagnosis found.  Update: Following provision of GI cocktail, fluids, the patient notes some improvement in his condition.    Date: 03/07/2012  Rate:62  Rhythm: normal sinus rhythm  QRS Axis: right  Intervals: normal  ST/T Wave abnormalities: normal  Conduction Disutrbances:right bundle branch block  Narrative Interpretation:   Old EKG Reviewed: none available ABNORMAL - non-ischemic  9:23 PM Patient in severe pain  2310: Patient substantially better.  No new complaint MDM  This generally well 37 year old male now presents after the subacute onset of abdominal pain.   The patient's description of epigastric pain, vomiting, nausea is suggestive of gastric or esophageal irritation.  Absent right upper quadrant pain, and with the generally soft abdomen, there is low suspicion for acute other GI pathology.  The patient has minimal cardiac risk factors, does not complain of chest pain or dyspnea, and there is low suspicion for ACS.  The improvement following GI cocktail is reassuring.  The patient's labs are largely unremarkable.  Following a period of observation, the patient was discharged in stable condition to     Gerhard Munch, MD 03/08/12 [phone]

## 2012-03-07 NOTE — ED Notes (Signed)
Pt dc to home.  Pt states understanding to dc paperwork.  Pt ambulatory to exit without difficulty.  Pt denies need for w/c. 

## 2012-03-07 NOTE — ED Notes (Signed)
Pt states feeling better.

## 2012-03-07 NOTE — ED Notes (Signed)
The pt has had epigastric pain with nv since  1200n today after he ate at [employer].  No previous history.  C/o severe pain at present

## 2012-03-07 NOTE — ED Notes (Signed)
The pt has been attempting to loose and has been calorie counting and has lost over 50 lbc.  He now reports that he had pain in nov after eating a rich meal

## 2014-08-25 ENCOUNTER — Other Ambulatory Visit: Payer: Self-pay | Admitting: Gastroenterology

## 2014-08-25 DIAGNOSIS — R1013 Epigastric pain: Secondary | ICD-10-CM

## 2014-09-01 ENCOUNTER — Ambulatory Visit
Admission: RE | Admit: 2014-09-01 | Discharge: 2014-09-01 | Disposition: A | Discharge status: Home / Self Care | Payer: 59 | Source: Ambulatory Visit | Attending: Gastroenterology | Admitting: Gastroenterology

## 2014-09-01 DIAGNOSIS — R1013 Epigastric pain: Secondary | ICD-10-CM

## 2014-09-04 ENCOUNTER — Emergency Department (HOSPITAL_COMMUNITY)
Admission: EM | Admit: 2014-09-04 | Discharge: 2014-09-04 | Disposition: A | Discharge status: Home / Self Care | Payer: 59 | Attending: Emergency Medicine | Admitting: Emergency Medicine

## 2014-09-04 ENCOUNTER — Encounter (HOSPITAL_COMMUNITY): Payer: Self-pay | Admitting: Emergency Medicine

## 2014-09-04 DIAGNOSIS — K8020 Calculus of gallbladder without cholecystitis without obstruction: Secondary | ICD-10-CM | POA: Diagnosis not present

## 2014-09-04 DIAGNOSIS — Z8619 Personal history of other infectious and parasitic diseases: Secondary | ICD-10-CM | POA: Insufficient documentation

## 2014-09-04 DIAGNOSIS — I10 Essential (primary) hypertension: Secondary | ICD-10-CM | POA: Insufficient documentation

## 2014-09-04 DIAGNOSIS — Z79899 Other long term (current) drug therapy: Secondary | ICD-10-CM | POA: Insufficient documentation

## 2014-09-04 DIAGNOSIS — R1010 Upper abdominal pain, unspecified: Secondary | ICD-10-CM | POA: Diagnosis present

## 2014-09-04 LAB — CBC WITH DIFFERENTIAL/PLATELET
BASOS PCT: 1 % (ref 0–1)
Basophils Absolute: 0.1 10*3/uL (ref 0.0–0.1)
EOS ABS: 0.3 10*3/uL (ref 0.0–0.7)
EOS PCT: 4 % (ref 0–5)
HEMATOCRIT: 43.6 % (ref 39.0–52.0)
Hemoglobin: 14.5 g/dL (ref 13.0–17.0)
Lymphocytes Relative: 28 % (ref 12–46)
Lymphs Abs: 2.2 10*3/uL (ref 0.7–4.0)
MCH: 26.9 pg (ref 26.0–34.0)
MCHC: 33.3 g/dL (ref 30.0–36.0)
MCV: 80.7 fL (ref 78.0–100.0)
Monocytes Absolute: 0.5 10*3/uL (ref 0.1–1.0)
Monocytes Relative: 6 % (ref 3–12)
NEUTROS PCT: 61 % (ref 43–77)
Neutro Abs: 4.7 10*3/uL (ref 1.7–7.7)
PLATELETS: 187 10*3/uL (ref 150–400)
RBC: 5.4 MIL/uL (ref 4.22–5.81)
RDW: 13.3 % (ref 11.5–15.5)
WBC: 7.7 10*3/uL (ref 4.0–10.5)

## 2014-09-04 LAB — COMPREHENSIVE METABOLIC PANEL
ALBUMIN: 4 g/dL (ref 3.5–5.0)
ALK PHOS: 105 U/L (ref 38–126)
ALT: 26 U/L (ref 17–63)
AST: 20 U/L (ref 15–41)
Anion gap: 7 (ref 5–15)
BILIRUBIN TOTAL: 0.7 mg/dL (ref 0.3–1.2)
BUN: 12 mg/dL (ref 6–20)
CHLORIDE: 106 mmol/L (ref 101–111)
CO2: 27 mmol/L (ref 22–32)
CREATININE: 1.02 mg/dL (ref 0.61–1.24)
Calcium: 8.8 mg/dL — ABNORMAL LOW (ref 8.9–10.3)
GLUCOSE: 95 mg/dL (ref 65–99)
POTASSIUM: 4.1 mmol/L (ref 3.5–5.1)
Sodium: 140 mmol/L (ref 135–145)
Total Protein: 7.6 g/dL (ref 6.5–8.1)

## 2014-09-04 LAB — LIPASE, BLOOD: LIPASE: 33 U/L (ref 22–51)

## 2014-09-04 MED ORDER — MORPHINE SULFATE 4 MG/ML IJ SOLN
4.0000 mg | Freq: Once | INTRAMUSCULAR | Status: AC
  Administered 2014-09-04: 4 mg via INTRAVENOUS
  Filled 2014-09-04: qty 1

## 2014-09-04 MED ORDER — HYDROCODONE-ACETAMINOPHEN 5-325 MG PO TABS: 1.0000 | ORAL_TABLET | ORAL | Status: DC | PRN

## 2014-09-04 NOTE — Discharge Instructions (Signed)
Biliary Colic  °Biliary colic is a steady or irregular pain in the upper abdomen. It is usually under the right side of the rib cage. It happens when gallstones interfere with the normal flow of bile from the gallbladder. Bile is a liquid that helps to digest fats. Bile is made in the liver and stored in the gallbladder. When you eat a meal, bile passes from the gallbladder through the cystic duct and the common bile duct into the small intestine. There, it mixes with partially digested food. If a gallstone blocks either of these ducts, the normal flow of bile is blocked. The muscle cells in the bile duct contract forcefully to try to move the stone. This causes the pain of biliary colic.  °SYMPTOMS  °· A person with biliary colic usually complains of pain in the upper abdomen. This pain can be: °¨ In the center of the upper abdomen just below the breastbone. °¨ In the upper-right part of the abdomen, near the gallbladder and liver. °¨ Spread back toward the right shoulder blade. °· Nausea and vomiting. °· The pain usually occurs after eating. °· Biliary colic is usually triggered by the digestive system's demand for bile. The demand for bile is high after fatty meals. Symptoms can also occur when a person who has been fasting suddenly eats a very large meal. Most episodes of biliary colic pass after 1 to 5 hours. After the most intense pain passes, your abdomen may continue to ache mildly for about 24 hours. °DIAGNOSIS  °After you describe your symptoms, your caregiver will perform a physical exam. He or she will pay attention to the upper right portion of your belly (abdomen). This is the area of your liver and gallbladder. An ultrasound will help your caregiver look for gallstones. Specialized scans of the gallbladder may also be done. Blood tests may be done, especially if you have fever or if your pain persists. °PREVENTION  °Biliary colic can be prevented by controlling the risk factors for gallstones. Some of  these risk factors, such as heredity, increasing age, and pregnancy are a normal part of life. Obesity and a high-fat diet are risk factors you can change through a healthy lifestyle. Women going through menopause who take hormone replacement therapy (estrogen) are also more likely to develop biliary colic. °TREATMENT  °· Pain medication may be prescribed. °· You may be encouraged to eat a fat-free diet. °· If the first episode of biliary colic is severe, or episodes of colic keep retuning, surgery to remove the gallbladder (cholecystectomy) is usually recommended. This procedure can be done through small incisions using an instrument called a laparoscope. The procedure often requires a brief stay in the hospital. Some people can leave the hospital the same day. It is the most widely used treatment in people troubled by painful gallstones. It is effective and safe, with no complications in more than 90% of cases. °· If surgery cannot be done, medication that dissolves gallstones may be used. This medication is expensive and can take months or years to work. Only small stones will dissolve. °· Rarely, medication to dissolve gallstones is combined with a procedure called shock-wave lithotripsy. This procedure uses carefully aimed shock waves to break up gallstones. In many people treated with this procedure, gallstones form again within a few years. °PROGNOSIS  °If gallstones block your cystic duct or common bile duct, you are at risk for repeated episodes of biliary colic. There is also a 25% chance that you will develop   a gallbladder infection(acute cholecystitis), or some other complication of gallstones within 10 to 20 years. If you have surgery, schedule it at a time that is convenient for you and at a time when you are not sick. °HOME CARE INSTRUCTIONS  °· Drink plenty of clear fluids. °· Avoid fatty, greasy or fried foods, or any foods that make your pain worse. °· Take medications as directed. °SEEK MEDICAL  CARE IF:  °· You develop a fever over 100.5° F (38.1° C). °· Your pain gets worse over time. °· You develop nausea that prevents you from eating and drinking. °· You develop vomiting. °SEEK IMMEDIATE MEDICAL CARE IF:  °· You have continuous or severe belly (abdominal) pain which is not relieved with medications. °· You develop nausea and vomiting which is not relieved with medications. °· You have symptoms of biliary colic and you suddenly develop a fever and shaking chills. This may signal cholecystitis. Call your caregiver immediately. °· You develop a yellow color to your skin or the white part of your eyes (jaundice). °Document Released: 07/02/2005 Document Revised: 04/23/2011 Document Reviewed: 09/11/2007 °[employer]® Patient Information ©2015 [employer]. This information is not intended to replace advice given to you by your health care provider. Make sure you discuss any questions you have with your health care provider. ° ° °Low-Fat Diet for Pancreatitis or Gallbladder Conditions °A low-fat diet can be helpful if you have pancreatitis or a gallbladder condition. With these conditions, your pancreas and gallbladder have trouble digesting fats. A healthy eating plan with less fat will help rest your pancreas and gallbladder and reduce your symptoms. °WHAT DO I NEED TO KNOW ABOUT THIS DIET? °· Eat a low-fat diet. °¨ Reduce your fat intake to less than 20-30% of your total daily calories. This is less than 50-60 g of fat per day. °¨ Remember that you need some fat in your diet. Ask your dietician what your daily goal should be. °¨ Choose nonfat and low-fat healthy foods. Look for the words "nonfat," "low fat," or "fat free." °¨ As a guide, look on the label and choose foods with less than 3 g of fat per serving. Eat only one serving. °· Avoid alcohol. °· Do not smoke. If you need help quitting, talk with your health care provider. °· Eat small frequent meals instead of three large heavy meals. °WHAT FOODS CAN I  EAT? °Grains °Include healthy grains and starches such as potatoes, wheat bread, fiber-rich cereal, and brown rice. Choose whole grain options whenever possible. In adults, whole grains should account for 45-65% of your daily calories.  °Fruits and Vegetables °Eat plenty of fruits and vegetables. Fresh fruits and vegetables add fiber to your diet. °Meats and Other Protein Sources °Eat lean meat such as chicken and pork. Trim any fat off of meat before cooking it. Eggs, fish, and beans are other sources of protein. In adults, these foods should account for 10-35% of your daily calories. °Dairy °Choose low-fat milk and dairy options. Dairy includes fat and protein, as well as calcium.  °Fats and Oils °Limit high-fat foods such as fried foods, sweets, baked goods, sugary drinks.  °Other °Creamy sauces and condiments, such as mayonnaise, can add extra fat. Think about whether or not you need to use them, or use smaller amounts or low fat options. °WHAT FOODS ARE NOT RECOMMENDED? °· High fat foods, such as: °¨ Baked goods. °¨ Ice cream. °¨ French toast. °¨ Sweet rolls. °¨ Pizza. °¨ Cheese bread. °¨ Foods covered with   batter, butter, creamy sauces, or cheese. °¨ Fried foods. °¨ Sugary drinks and desserts. °· Foods that cause gas or bloating °Document Released: 02/03/2013 Document Reviewed: 02/03/2013 °Exit[employer]® Patient Information ©2015 [employer]. This information is not intended to replace advice given to you by your health care provider. Make sure you discuss any questions you have with your health care provider. ° °

## 2014-09-04 NOTE — ED Notes (Signed)
Pt states that he has been having abd pain and has been seeing dr. Benson Norway.  States that he had an [place] on Wednesday that showed gallstones and sludge.  Has an appt with surgery next Thursday but was told to come to the ER if he had any more pain.  States he has been hurting since the [place].

## 2014-09-04 NOTE — ED Provider Notes (Signed)
CSN: [id number]     Arrival date & time 09/04/14  1016 History   First MD Initiated Contact with Patient 09/04/14 1115     Chief Complaint  Patient presents with  . Abdominal Pain     (Consider location/radiation/quality/duration/timing/severity/associated sxs/prior Treatment) HPI Comments: Patient presents with abdominal pain. He has been seeing Dr. Benson Norway a week and half ago he saw him with upper abdominal pain, primarily in the epigastric region. Dr. Benson Norway felt that he had gallbladder disease and sent him for an ultrasound which did show gallstones which she felt was a cause of the patient's pain. He's had some ongoing symptoms since that time with episodes of increased pain and nausea. This recent episode was during the night last night. He currently does have some ongoing pain to his epigastrium and some nausea but no vomiting. He denies any fevers. He's been taking Zofran at home with some improvement of symptoms. He has upcoming appointment to see general surgery on Thursday but he states his pain got worse so he came in to be evaluated.  Patient is a 39 y.o. male presenting with abdominal pain.  Abdominal Pain Associated symptoms: nausea   Associated symptoms: no chest pain, no chills, no cough, no diarrhea, no fatigue, no fever, no hematuria, no shortness of breath and no vomiting     Past Medical History  Diagnosis Date  . Hypertension   . Allergy   . Chickenpox   . Obesity   . Obesity    Past Surgical History  Procedure Laterality Date  . Left knee arthroscopy    . Left shoulder arthroscopy    . Nose surgery     Family History  Problem Relation Age of Onset  . Hypertension     History  Substance Use Topics  . Smoking status: Never Smoker   . Smokeless tobacco: Never Used  . Alcohol Use: No    Review of Systems  Constitutional: Negative for fever, chills, diaphoresis and fatigue.  HENT: Negative for congestion, rhinorrhea and sneezing.   Eyes: Negative.    Respiratory: Negative for cough, chest tightness and shortness of breath.   Cardiovascular: Negative for chest pain and leg swelling.  Gastrointestinal: Positive for nausea and abdominal pain. Negative for vomiting, diarrhea and blood in stool.  Genitourinary: Negative for frequency, hematuria, flank pain and difficulty urinating.  Musculoskeletal: Negative for back pain and arthralgias.  Skin: Negative for rash.  Neurological: Negative for dizziness, speech difficulty, weakness, numbness and headaches.      Allergies  Review of patient's allergies indicates no known allergies.  Home Medications   Prior to Admission medications   Medication Sig Start Date End Date Taking? Authorizing Provider  acetaminophen (TYLENOL) 500 MG tablet Take 500 mg by mouth every 6 (six) hours as needed. For pain   Yes Historical Provider, MD  lansoprazole (PREVACID) 15 MG capsule Take 15 mg by mouth daily at 12 noon.   Yes Historical Provider, MD  omeprazole (PRILOSEC) 20 MG capsule Take 1 capsule (20 mg total) by mouth daily. Patient not taking: Reported on 09/04/2014 03/07/12   Carmin Muskrat, MD  sucralfate (CARAFATE) 1 GM/10ML suspension Take 10 mLs (1 g total) by mouth every 6 (six) hours as needed. Patient not taking: Reported on 09/04/2014 03/07/12   Carmin Muskrat, MD   BP 190/100 mmHg  Pulse 60  Temp(Src) 98.7 F (37.1 C) (Oral)  Resp 18  SpO2 99% Physical Exam  Constitutional: He is oriented to person, place, and time. He  appears well-developed and well-nourished.  Morbidly obese  HENT:  Head: Normocephalic and atraumatic.  Eyes: Pupils are equal, round, and reactive to light.  Neck: Normal range of motion. Neck supple.  Cardiovascular: Normal rate, regular rhythm and normal heart sounds.   Pulmonary/Chest: Effort normal and breath sounds normal. No respiratory distress. He has no wheezes. He has no rales. He exhibits no tenderness.  Abdominal: Soft. Bowel sounds are normal. There is  tenderness (Tenderness to the epigastrium and across upper abdomen). There is no rebound and no guarding.  Musculoskeletal: Normal range of motion. He exhibits no edema.  Lymphadenopathy:    He has no cervical adenopathy.  Neurological: He is alert and oriented to person, place, and time.  Skin: Skin is warm and dry. No rash noted.  Psychiatric: He has a normal mood and affect.    ED Course  Procedures (including critical care time) Labs Review Labs Reviewed  COMPREHENSIVE METABOLIC PANEL - Abnormal; Notable for the following:    Calcium 8.8 (*)    All other components within normal limits  CBC WITH DIFFERENTIAL/PLATELET  LIPASE, BLOOD    Imaging Review No results found.   EKG Interpretation None      MDM   Final diagnoses:  Calculus of gallbladder without cholecystitis without obstruction    Pt with known gallstones diagnosed week and half ago. There is no evidence of cholecystitis. No fever. No elevated white count. No evidence of obstruction. His pain is controlled in the ED. He has no vomiting. I spoke with Dr. Zella Richer who advises that patient can be discharged home. I did advise the patient on a clear liquid diet for the next 48 hours and then after that a nonfat diet. I discussed this with the patient. Dr. Zella Richer does state that if patient has to come back to the ER a second time then the surgery team will go ahead and admit him at that time to have his gallbladder removed. Otherwise he can follow-up with [facility] surgery on Thursday as previously scheduled. Patient was advised to return if he has worsening pain vomiting or fevers. He was given prescription for Vicodin. He states he has Zofran at home to use.    Malvin Johns, MD 09/04/14 1251

## 2014-09-07 ENCOUNTER — Encounter (HOSPITAL_COMMUNITY): Payer: Self-pay

## 2014-09-07 ENCOUNTER — Observation Stay (HOSPITAL_COMMUNITY)
Admission: EM | Admit: 2014-09-07 | Discharge: 2014-09-09 | Disposition: A | Discharge status: Home / Self Care | Payer: 59 | Attending: General Surgery | Admitting: General Surgery

## 2014-09-07 DIAGNOSIS — Z6841 Body Mass Index (BMI) 40.0 and over, adult: Secondary | ICD-10-CM | POA: Insufficient documentation

## 2014-09-07 DIAGNOSIS — Z79899 Other long term (current) drug therapy: Secondary | ICD-10-CM | POA: Insufficient documentation

## 2014-09-07 DIAGNOSIS — K8010 Calculus of gallbladder with chronic cholecystitis without obstruction: Secondary | ICD-10-CM | POA: Diagnosis not present

## 2014-09-07 DIAGNOSIS — I10 Essential (primary) hypertension: Secondary | ICD-10-CM | POA: Diagnosis not present

## 2014-09-07 DIAGNOSIS — K8020 Calculus of gallbladder without cholecystitis without obstruction: Secondary | ICD-10-CM | POA: Diagnosis present

## 2014-09-07 DIAGNOSIS — Z419 Encounter for procedure for purposes other than remedying health state, unspecified: Secondary | ICD-10-CM

## 2014-09-07 DIAGNOSIS — R109 Unspecified abdominal pain: Secondary | ICD-10-CM | POA: Diagnosis present

## 2014-09-07 HISTORY — DX: Failed or difficult intubation, initial encounter: T88.4XXA

## 2014-09-07 LAB — COMPREHENSIVE METABOLIC PANEL
ALK PHOS: 116 U/L (ref 38–126)
ALT: 26 U/L (ref 17–63)
AST: 20 U/L (ref 15–41)
Albumin: 3.9 g/dL (ref 3.5–5.0)
Anion gap: 6 (ref 5–15)
BUN: 11 mg/dL (ref 6–20)
CALCIUM: 9 mg/dL (ref 8.9–10.3)
CO2: 29 mmol/L (ref 22–32)
CREATININE: 1.06 mg/dL (ref 0.61–1.24)
Chloride: 104 mmol/L (ref 101–111)
GFR calc Af Amer: >60 mL/min (ref >60)
Glucose, Bld: 99 mg/dL (ref 65–99)
Potassium: 4.3 mmol/L (ref 3.5–5.1)
SODIUM: 139 mmol/L (ref 135–145)
Total Bilirubin: 0.7 mg/dL (ref 0.3–1.2)
Total Protein: 7.6 g/dL (ref 6.5–8.1)

## 2014-09-07 LAB — CBC
HCT: 44.1 % (ref 39.0–52.0)
Hemoglobin: 14.8 g/dL (ref 13.0–17.0)
MCH: 27.2 pg (ref 26.0–34.0)
MCHC: 33.6 g/dL (ref 30.0–36.0)
MCV: 80.9 fL (ref 78.0–100.0)
Platelets: 189 10*3/uL (ref 150–400)
RBC: 5.45 MIL/uL (ref 4.22–5.81)
RDW: 13.2 % (ref 11.5–15.5)
WBC: 10.2 10*3/uL (ref 4.0–10.5)

## 2014-09-07 LAB — LIPASE, BLOOD: LIPASE: 25 U/L (ref 22–51)

## 2014-09-07 NOTE — ED Notes (Signed)
Pt presents with c/o upper abdominal pain. Pt reports he was seen here earlier this week. Pt was recently told he had gallstones and sludge. Pt reports the pain is not getting any better even with the pain medicine.

## 2014-09-08 ENCOUNTER — Observation Stay (HOSPITAL_COMMUNITY): Payer: 59

## 2014-09-08 ENCOUNTER — Encounter (HOSPITAL_COMMUNITY): Payer: Self-pay | Admitting: Certified Registered Nurse Anesthetist

## 2014-09-08 ENCOUNTER — Observation Stay (HOSPITAL_COMMUNITY): Payer: 59 | Admitting: Certified Registered Nurse Anesthetist

## 2014-09-08 ENCOUNTER — Encounter (HOSPITAL_COMMUNITY)
Admission: EM | Disposition: A | Discharge status: Home / Self Care | Payer: Self-pay | Source: Home / Self Care | Attending: Emergency Medicine

## 2014-09-08 DIAGNOSIS — K8020 Calculus of gallbladder without cholecystitis without obstruction: Secondary | ICD-10-CM | POA: Diagnosis present

## 2014-09-08 HISTORY — PX: CHOLECYSTECTOMY: SHX55

## 2014-09-08 LAB — URINALYSIS, ROUTINE W REFLEX MICROSCOPIC
BILIRUBIN URINE: NEGATIVE
GLUCOSE, UA: NEGATIVE mg/dL
Hgb urine dipstick: NEGATIVE
Ketones, ur: NEGATIVE mg/dL
LEUKOCYTES UA: NEGATIVE
NITRITE: NEGATIVE
PH: 7.5 (ref 5.0–8.0)
Protein, ur: NEGATIVE mg/dL
Specific Gravity, Urine: 1.019 (ref 1.005–1.030)
Urobilinogen, UA: 1 mg/dL (ref 0.0–1.0)

## 2014-09-08 LAB — CBC
HCT: 41.6 % (ref 39.0–52.0)
HEMOGLOBIN: 13.8 g/dL (ref 13.0–17.0)
MCH: 26.7 pg (ref 26.0–34.0)
MCHC: 33.2 g/dL (ref 30.0–36.0)
MCV: 80.5 fL (ref 78.0–100.0)
Platelets: 177 10*3/uL (ref 150–400)
RBC: 5.17 MIL/uL (ref 4.22–5.81)
RDW: 13.1 % (ref 11.5–15.5)
WBC: 7.7 10*3/uL (ref 4.0–10.5)

## 2014-09-08 LAB — CREATININE, SERUM
CREATININE: 1 mg/dL (ref 0.61–1.24)
GFR calc Af Amer: >60 mL/min (ref >60)
GFR calc non Af Amer: >60 mL/min (ref >60)

## 2014-09-08 LAB — SURGICAL PCR SCREEN
MRSA, PCR: NEGATIVE
STAPHYLOCOCCUS AUREUS: NEGATIVE

## 2014-09-08 SURGERY — LAPAROSCOPIC CHOLECYSTECTOMY WITH INTRAOPERATIVE CHOLANGIOGRAM
Anesthesia: General | Site: Abdomen

## 2014-09-08 MED ORDER — SUCCINYLCHOLINE CHLORIDE 20 MG/ML IJ SOLN
INTRAMUSCULAR | Status: DC | PRN
  Administered 2014-09-08: 150 mg via INTRAVENOUS

## 2014-09-08 MED ORDER — PROMETHAZINE HCL 25 MG/ML IJ SOLN
6.2500 mg | INTRAMUSCULAR | Status: DC | PRN
Start: 2014-09-08 — End: 2014-09-08

## 2014-09-08 MED ORDER — LIDOCAINE HCL (CARDIAC) 20 MG/ML IV SOLN
INTRAVENOUS | Status: DC | PRN
  Administered 2014-09-08: 100 mg via INTRAVENOUS

## 2014-09-08 MED ORDER — MORPHINE SULFATE 2 MG/ML IJ SOLN
1.0000 mg | INTRAMUSCULAR | Status: DC | PRN
  Administered 2014-09-08: 4 mg via INTRAVENOUS
  Administered 2014-09-08 – 2014-09-09 (×4): 2 mg via INTRAVENOUS
  Filled 2014-09-08 (×6): qty 1

## 2014-09-08 MED ORDER — LIDOCAINE HCL (CARDIAC) 20 MG/ML IV SOLN
INTRAVENOUS | Status: AC
  Filled 2014-09-08: qty 5

## 2014-09-08 MED ORDER — HYDROMORPHONE HCL 1 MG/ML IJ SOLN
INTRAMUSCULAR | Status: AC
  Filled 2014-09-08: qty 1

## 2014-09-08 MED ORDER — CEFTRIAXONE SODIUM 2 G IJ SOLR
INTRAMUSCULAR | Status: AC
  Filled 2014-09-08: qty 2

## 2014-09-08 MED ORDER — GLYCOPYRROLATE 0.2 MG/ML IJ SOLN
INTRAMUSCULAR | Status: DC | PRN
  Administered 2014-09-08: .8 mg via INTRAVENOUS

## 2014-09-08 MED ORDER — ONDANSETRON HCL 4 MG/2ML IJ SOLN
INTRAMUSCULAR | Status: AC
  Filled 2014-09-08: qty 2

## 2014-09-08 MED ORDER — HYDROMORPHONE HCL 1 MG/ML IJ SOLN
0.2500 mg | INTRAMUSCULAR | Status: DC | PRN
  Administered 2014-09-08 (×2): 0.5 mg via INTRAVENOUS

## 2014-09-08 MED ORDER — DEXAMETHASONE SODIUM PHOSPHATE 10 MG/ML IJ SOLN
INTRAMUSCULAR | Status: DC | PRN
  Administered 2014-09-08: 10 mg via INTRAVENOUS

## 2014-09-08 MED ORDER — DEXAMETHASONE SODIUM PHOSPHATE 10 MG/ML IJ SOLN
INTRAMUSCULAR | Status: AC
  Filled 2014-09-08: qty 1

## 2014-09-08 MED ORDER — BUPIVACAINE-EPINEPHRINE (PF) 0.25% -1:200000 IJ SOLN
INTRAMUSCULAR | Status: AC
  Filled 2014-09-08: qty 30

## 2014-09-08 MED ORDER — SODIUM CHLORIDE 0.9 % IV BOLUS (SEPSIS)
1000.0000 mL | Freq: Once | INTRAVENOUS | Status: AC
  Administered 2014-09-08: 1000 mL via INTRAVENOUS

## 2014-09-08 MED ORDER — HYDROMORPHONE HCL 1 MG/ML IJ SOLN
1.0000 mg | INTRAMUSCULAR | Status: AC | PRN
  Administered 2014-09-08: 1 mg via INTRAVENOUS
  Filled 2014-09-08: qty 1

## 2014-09-08 MED ORDER — HYDRALAZINE HCL 20 MG/ML IJ SOLN
INTRAMUSCULAR | Status: AC
  Filled 2014-09-08: qty 1

## 2014-09-08 MED ORDER — ONDANSETRON HCL 4 MG/2ML IJ SOLN: 4.0000 mg | Freq: Four times a day (QID) | INTRAMUSCULAR | Status: DC | PRN

## 2014-09-08 MED ORDER — CEFTRIAXONE SODIUM IN DEXTROSE 40 MG/ML IV SOLN: 2.0000 g | INTRAVENOUS | Status: DC

## 2014-09-08 MED ORDER — HYDRALAZINE HCL 20 MG/ML IJ SOLN
10.0000 mg | Freq: Once | INTRAMUSCULAR | Status: AC
  Administered 2014-09-08: 10 mg via INTRAVENOUS

## 2014-09-08 MED ORDER — ONDANSETRON HCL 4 MG/2ML IJ SOLN
INTRAMUSCULAR | Status: DC | PRN
  Administered 2014-09-08: 4 mg via INTRAVENOUS

## 2014-09-08 MED ORDER — 0.9 % SODIUM CHLORIDE (POUR BTL) OPTIME
TOPICAL | Status: DC | PRN
  Administered 2014-09-08: 1000 mL

## 2014-09-08 MED ORDER — LACTATED RINGERS IR SOLN
Status: DC | PRN
  Administered 2014-09-08: 1000 mL

## 2014-09-08 MED ORDER — LABETALOL HCL 5 MG/ML IV SOLN
INTRAVENOUS | Status: AC
  Filled 2014-09-08: qty 4

## 2014-09-08 MED ORDER — LABETALOL HCL 5 MG/ML IV SOLN
10.0000 mg | Freq: Once | INTRAVENOUS | Status: AC | PRN
  Administered 2014-09-08: 10 mg via INTRAVENOUS

## 2014-09-08 MED ORDER — MIDAZOLAM HCL 5 MG/5ML IJ SOLN
INTRAMUSCULAR | Status: DC | PRN
  Administered 2014-09-08: 2 mg via INTRAVENOUS

## 2014-09-08 MED ORDER — DEXTROSE 5 % IV SOLN
1.0000 g | Freq: Once | INTRAVENOUS | Status: AC
  Administered 2014-09-08: 1 g via INTRAVENOUS
  Filled 2014-09-08: qty 10

## 2014-09-08 MED ORDER — GLYCOPYRROLATE 0.2 MG/ML IJ SOLN
INTRAMUSCULAR | Status: AC
  Filled 2014-09-08: qty 3

## 2014-09-08 MED ORDER — CEFTRIAXONE SODIUM IN DEXTROSE 40 MG/ML IV SOLN
2.0000 g | INTRAVENOUS | Status: AC
  Administered 2014-09-08: 2 g via INTRAVENOUS

## 2014-09-08 MED ORDER — NEOSTIGMINE METHYLSULFATE 10 MG/10ML IV SOLN
INTRAVENOUS | Status: AC
Start: 2014-09-08 — End: 2014-09-08
  Filled 2014-09-08: qty 1

## 2014-09-08 MED ORDER — ROCURONIUM BROMIDE 100 MG/10ML IV SOLN
INTRAVENOUS | Status: AC
  Filled 2014-09-08: qty 1

## 2014-09-08 MED ORDER — MIDAZOLAM HCL 2 MG/2ML IJ SOLN
INTRAMUSCULAR | Status: AC
  Filled 2014-09-08: qty 2

## 2014-09-08 MED ORDER — LIDOCAINE HCL (PF) 1 % IJ SOLN
INTRAMUSCULAR | Status: DC | PRN
  Administered 2014-09-08: 10 mL

## 2014-09-08 MED ORDER — METOPROLOL TARTRATE 1 MG/ML IV SOLN
5.0000 mg | Freq: Four times a day (QID) | INTRAVENOUS | Status: DC | PRN
  Administered 2014-09-08 – 2014-09-09 (×2): 5 mg via INTRAVENOUS
  Filled 2014-09-08 (×3): qty 5

## 2014-09-08 MED ORDER — BUPIVACAINE-EPINEPHRINE (PF) 0.25% -1:200000 IJ SOLN
INTRAMUSCULAR | Status: DC | PRN
  Administered 2014-09-08: 10 mL

## 2014-09-08 MED ORDER — FENTANYL CITRATE (PF) 100 MCG/2ML IJ SOLN
INTRAMUSCULAR | Status: DC | PRN
  Administered 2014-09-08 (×5): 50 ug via INTRAVENOUS

## 2014-09-08 MED ORDER — NEOSTIGMINE METHYLSULFATE 10 MG/10ML IV SOLN
INTRAVENOUS | Status: DC | PRN
  Administered 2014-09-08: 5 mg via INTRAVENOUS

## 2014-09-08 MED ORDER — MORPHINE SULFATE 4 MG/ML IJ SOLN
4.0000 mg | Freq: Once | INTRAMUSCULAR | Status: AC
  Administered 2014-09-08: 4 mg via INTRAVENOUS
  Filled 2014-09-08: qty 1

## 2014-09-08 MED ORDER — PROPOFOL 10 MG/ML IV BOLUS
INTRAVENOUS | Status: AC
  Filled 2014-09-08: qty 20

## 2014-09-08 MED ORDER — ROCURONIUM BROMIDE 100 MG/10ML IV SOLN
INTRAVENOUS | Status: DC | PRN
  Administered 2014-09-08: 10 mg via INTRAVENOUS
  Administered 2014-09-08: 50 mg via INTRAVENOUS
  Administered 2014-09-08: 10 mg via INTRAVENOUS

## 2014-09-08 MED ORDER — CEFTRIAXONE SODIUM IN DEXTROSE 20 MG/ML IV SOLN: 1.0000 g | INTRAVENOUS | Status: DC

## 2014-09-08 MED ORDER — HEPARIN SODIUM (PORCINE) 5000 UNIT/ML IJ SOLN
5000.0000 [IU] | Freq: Three times a day (TID) | INTRAMUSCULAR | Status: DC
  Administered 2014-09-08 – 2014-09-09 (×3): 5000 [IU] via SUBCUTANEOUS
  Filled 2014-09-08 (×7): qty 1

## 2014-09-08 MED ORDER — LIDOCAINE HCL 1 % IJ SOLN
INTRAMUSCULAR | Status: AC
  Filled 2014-09-08: qty 20

## 2014-09-08 MED ORDER — ONDANSETRON HCL 4 MG/2ML IJ SOLN
4.0000 mg | Freq: Three times a day (TID) | INTRAMUSCULAR | Status: DC | PRN
  Filled 2014-09-08: qty 2

## 2014-09-08 MED ORDER — LACTATED RINGERS IV SOLN
INTRAVENOUS | Status: DC
  Administered 2014-09-08: 15:00:00 via INTRAVENOUS
  Administered 2014-09-08: 1000 mL via INTRAVENOUS

## 2014-09-08 MED ORDER — KCL IN DEXTROSE-NACL 20-5-0.9 MEQ/L-%-% IV SOLN
INTRAVENOUS | Status: DC
  Administered 2014-09-08 – 2014-09-09 (×3): via INTRAVENOUS
  Filled 2014-09-08 (×4): qty 1000

## 2014-09-08 MED ORDER — PHENYLEPHRINE HCL 10 MG/ML IJ SOLN
INTRAMUSCULAR | Status: DC | PRN
  Administered 2014-09-08: 40 ug via INTRAVENOUS
  Administered 2014-09-08: 80 ug via INTRAVENOUS

## 2014-09-08 MED ORDER — PROPOFOL 10 MG/ML IV BOLUS
INTRAVENOUS | Status: DC | PRN
  Administered 2014-09-08: 300 mg via INTRAVENOUS

## 2014-09-08 MED ORDER — ONDANSETRON HCL 4 MG/2ML IJ SOLN
4.0000 mg | Freq: Once | INTRAMUSCULAR | Status: AC
  Administered 2014-09-08: 4 mg via INTRAVENOUS
  Filled 2014-09-08: qty 2

## 2014-09-08 MED ORDER — FENTANYL CITRATE (PF) 250 MCG/5ML IJ SOLN
INTRAMUSCULAR | Status: AC
  Filled 2014-09-08: qty 5

## 2014-09-08 MED ORDER — SODIUM CHLORIDE 0.9 % IV SOLN
INTRAVENOUS | Status: AC
  Administered 2014-09-08: 01:00:00 via INTRAVENOUS

## 2014-09-08 SURGICAL SUPPLY — 35 items
APPLIER CLIP ROT 10 11.4 M/L (STAPLE) ×2
CHLORAPREP W/TINT 26ML (MISCELLANEOUS) ×4 IMPLANT
CLIP APPLIE ROT 10 11.4 M/L (STAPLE) ×1 IMPLANT
CLIP LIGATING HEMO O LOK GREEN (MISCELLANEOUS) IMPLANT
COVER MAYO STAND STRL (DRAPES) ×2 IMPLANT
COVER SURGICAL LIGHT HANDLE (MISCELLANEOUS) ×2 IMPLANT
DECANTER SPIKE VIAL GLASS SM (MISCELLANEOUS) ×2 IMPLANT
DERMABOND ADVANCED (GAUZE/BANDAGES/DRESSINGS) ×1
DERMABOND ADVANCED .7 DNX12 (GAUZE/BANDAGES/DRESSINGS) ×1 IMPLANT
DRAPE C-ARM 42X120 X-RAY (DRAPES) ×2 IMPLANT
DRAPE LAPAROSCOPIC ABDOMINAL (DRAPES) ×2 IMPLANT
ELECT REM PT RETURN 9FT ADLT (ELECTROSURGICAL) ×2
ELECTRODE REM PT RTRN 9FT ADLT (ELECTROSURGICAL) ×1 IMPLANT
GLOVE BIO SURGEON STRL SZ 6 (GLOVE) ×2 IMPLANT
GLOVE INDICATOR 6.5 STRL GRN (GLOVE) ×2 IMPLANT
GOWN STRL REUS W/TWL 2XL LVL3 (GOWN DISPOSABLE) ×2 IMPLANT
GOWN STRL REUS W/TWL XL LVL3 (GOWN DISPOSABLE) ×6 IMPLANT
HEMOSTAT SNOW SURGICEL 2X4 (HEMOSTASIS) ×2 IMPLANT
KIT BASIN OR (CUSTOM PROCEDURE TRAY) ×2 IMPLANT
L-HOOK LAP DISP 36CM (ELECTROSURGICAL) ×2
LHOOK LAP DISP 36CM (ELECTROSURGICAL) ×1 IMPLANT
LIQUID BAND (GAUZE/BANDAGES/DRESSINGS) IMPLANT
POUCH SPECIMEN RETRIEVAL 10MM (ENDOMECHANICALS) ×2 IMPLANT
SCISSORS LAP 5X35 DISP (ENDOMECHANICALS) ×2 IMPLANT
SET CHOLANGIOGRAPH MIX (MISCELLANEOUS) IMPLANT
SET IRRIG TUBING LAPAROSCOPIC (IRRIGATION / IRRIGATOR) ×2 IMPLANT
SLEEVE XCEL OPT CAN 5 100 (ENDOMECHANICALS) ×2 IMPLANT
SUT MNCRL AB 4-0 PS2 18 (SUTURE) ×2 IMPLANT
SUT VICRYL 0 UR6 27IN ABS (SUTURE) ×2 IMPLANT
TOWEL OR 17X26 10 PK STRL BLUE (TOWEL DISPOSABLE) ×2 IMPLANT
TOWEL OR NON WOVEN STRL DISP B (DISPOSABLE) ×2 IMPLANT
TRAY LAPAROSCOPIC (CUSTOM PROCEDURE TRAY) ×2 IMPLANT
TROCAR BLADELESS OPT 5 100 (ENDOMECHANICALS) ×2 IMPLANT
TROCAR XCEL BLUNT TIP 100MML (ENDOMECHANICALS) ×2 IMPLANT
TROCAR XCEL NON-BLD 11X100MML (ENDOMECHANICALS) ×2 IMPLANT

## 2014-09-08 NOTE — Anesthesia Preprocedure Evaluation (Addendum)
Anesthesia Evaluation  Patient identified by MRN, date of birth, ID band Patient awake    Reviewed: Allergy & Precautions, H&P , NPO status , Patient's Chart, lab work & pertinent test results, Unable to perform ROS - Chart review only  History of Anesthesia Complications Negative for: history of anesthetic complications  Airway Mallampati: IV  TM Distance: >3 FB Neck ROM: full   Comment: Very poor, unreassuring airway Dental no notable dental hx.    Pulmonary neg pulmonary ROS,  breath sounds clear to auscultation  Pulmonary exam normal       Cardiovascular hypertension, Pt. on medications Normal cardiovascular examRhythm:regular Rate:Normal     Neuro/Psych negative neurological ROS  negative psych ROS   GI/Hepatic negative GI ROS, Neg liver ROS,   Endo/Other  Morbid obesity  Renal/GU negative Renal ROS     Musculoskeletal   Abdominal (+) + obese,   Peds  Hematology negative hematology ROS (+)   Anesthesia Other Findings EKG 2014 - possible RBBB, otherwise wnml  NPO appropriate, allergies reviewed Denies active cardiac or pulmonary symptoms, METS > 4 No recent congestive cough or symptoms of upper respiratory infection Meds - dilaudid, morphine, metoprolol currently as inpatient   Reproductive/Obstetrics negative OB ROS                         Anesthesia Physical Anesthesia Plan  ASA: III  Anesthesia Plan: General   Post-op Pain Management:    Induction:   Airway Management Planned: Oral ETT  Additional Equipment:   Intra-op Plan:   Post-operative Plan:   Informed Consent: I have reviewed the patients History and Physical, chart, labs and discussed the procedure including the risks, benefits and alternatives for the proposed anesthesia with the patient or authorized representative who has indicated his/her understanding and acceptance.   Dental Advisory Given  Plan  Discussed with: Anesthesiologist and CRNA  Anesthesia Plan Comments: (Ramp, RSI)        Anesthesia Quick Evaluation

## 2014-09-08 NOTE — Anesthesia Procedure Notes (Addendum)
Procedure Name: Intubation Date/Time: 09/08/2014 1:18 PM Performed by: Montel Clock Pre-anesthesia Checklist: Patient identified, Emergency Drugs available, Suction available, Patient being monitored and Timeout performed Patient Re-evaluated:Patient Re-evaluated prior to inductionOxygen Delivery Method: Circle system utilized Preoxygenation: Pre-oxygenation with 100% oxygen Intubation Type: IV induction and Rapid sequence Laryngoscope Size: Glidescope and 4 Grade View: Grade II Tube type: Parker flex tip Tube size: 7.5 mm Number of attempts: 1 Airway Equipment and Method: Rigid stylet and Video-laryngoscopy Placement Confirmation: ETT inserted through vocal cords under direct vision,  positive ETCO2 and breath sounds checked- equal and bilateral Secured at: 23 cm Tube secured with: Tape Dental Injury: Teeth and Oropharynx as per pre-operative assessment  Difficulty Due To: Difficult Airway- due to limited oral opening and Difficult Airway- due to large tongue Comments: Elective glidescope r/t  habitus/obesity. Limited oral opening. ETT easily passed through cords after able to pass ETT/balloon past teeth. Patient ramped up on blankets prior to induction.

## 2014-09-08 NOTE — Progress Notes (Signed)
Voided large amt urine incontinently requiring complete bed change and TED hose saturated.

## 2014-09-08 NOTE — ED Notes (Signed)
Patient reports history of hypertension controlled by medication.  He states that he stopped taking medication when he lost weight, because his BP was stable.  He has since gained weight back, but has not restarted BP meds.

## 2014-09-08 NOTE — Progress Notes (Signed)
Notified Dr. Hassell Done about pt elevated blood pressures. No new orders given. Will continue to monitor.

## 2014-09-08 NOTE — Op Note (Signed)
Laparoscopic Cholecystectomy  Indications: This patient presents with symptomatic cholelithiasis and will undergo laparoscopic cholecystectomy.  Pre-operative Diagnosis: symptomatic cholelithiasis  Post-operative Diagnosis: acute cholecystitis with calculus  Surgeon: Stark Klein   Assistants: Leighton Ruff  Anesthesia: General endotracheal anesthesia and local  ASA Class: 3  Procedure Details  The patient was seen again in the Holding Room. The risks, benefits, complications, treatment options, and expected outcomes were discussed with the patient. The possibilities of  bleeding, recurrent infection, damage to nearby structures, the need for additional procedures, failure to diagnose a condition, the possible need to convert to an open procedure, and creating a complication requiring transfusion or operation were discussed with the patient. The likelihood of improving the patient's symptoms with return to their baseline status is good.    The patient and/or family concurred with the proposed plan, giving informed consent. The site of surgery properly noted. The patient was taken to Operating Room, and the procedure verified as Laparoscopic Cholecystectomy with possible Intraoperative Cholangiogram. A Time Out was held and the above information confirmed.  Prior to the induction of general anesthesia, antibiotic prophylaxis was administered. General endotracheal anesthesia was then administered and tolerated well. The patient was noted to have a difficult airway. Intubation was achieved with a Glidescope.   After the induction, the abdomen was prepped with Chloraprep and draped in the sterile fashion. The patient was positioned in the supine position.  Local anesthetic agent was injected into the skin near the umbilicus and an incision made. We dissected down to the abdominal fascia with blunt dissection.  The fascia was incised vertically and we entered the peritoneal cavity bluntly.  A  pursestring suture of 0-Vicryl was placed around the fascial opening.  The Hasson cannula was inserted and secured with the stay suture.  Pneumoperitoneum was then created with CO2 and tolerated well without any adverse changes in the patient's vital signs. An 11-mm port was placed in the subxiphoid position.  Two 5-mm ports were placed in the right upper quadrant. All skin incisions were infiltrated with a local anesthetic agent before making the incision and placing the trocars.   We positioned the patient in reverse Trendelenburg, tilted slightly to the patient's left.  The gallbladder was identified, the fundus grasped and retracted cephalad. Adhesions were lysed bluntly and with the electrocautery where indicated, taking care not to injure any adjacent organs or viscus. The patient was noted to have a significant Phrygian cap.  The infundibulum was grasped and retracted laterally.  The node of Calot was identified, and the artery was tethering the phrygian cap.  The artery was clipped and divided.  This allowed the gallbladder to stretch out.  A critical view of the cystic duct and cystic artery was obtained.  The cystic duct was clearly identified and bluntly dissected circumferentially. The cystic duct appeared very short.  The cystic duct was then ligated with plastic clips and divided. The cystic artery was identified, dissected free, ligated with clips and divided as well.   The gallbladder was dissected from the liver bed in retrograde fashion with the electrocautery. The gallbladder was removed and placed in an Endocatch bag.  The gallbladder and Endocatch bag were then removed through the umbilical port site.  The liver bed was irrigated and inspected. Hemostasis was achieved with the electrocautery. Copious irrigation was utilized and was repeatedly aspirated until clear.    We again inspected the right upper quadrant for hemostasis.  Pneumoperitoneum was released as we removed the trocars.   The  pursestring suture was used to close the umbilical fascia.  4-0 Monocryl was used to close the skin.   The skin was cleaned and dry, and Dermabond was applied. The patient was then extubated and brought to the recovery room in stable condition. Instrument, sponge, and needle counts were correct at closure and at the conclusion of the case.   Findings: Acute inflammation, fatty liver, large phrygian cap.    Estimated Blood Loss: <25 ml         Drains: none          Specimens: Gallbladder to pathology       Complications: None; patient tolerated the procedure well.         Disposition: PACU - hemodynamically stable.         Condition: stable

## 2014-09-08 NOTE — Anesthesia Postprocedure Evaluation (Addendum)
  Anesthesia Post-op Note  Patient: Justin White  Procedure(s) Performed: Procedure(s) (LRB): LAPAROSCOPIC CHOLECYSTECTOMY (N/A)  Patient Location: PACU  Anesthesia Type: General  Level of Consciousness: awake and alert   Airway and Oxygen Therapy: Patient Spontanous Breathing  Post-op Pain: mild  Post-op Assessment: Post-op Vital signs reviewed, Patient's Cardiovascular Status Stable, Respiratory Function Stable, Patent Airway and No signs of Nausea or vomiting  Last Vitals:  Filed Vitals:   09/08/14 1600  BP: 183/95  Pulse: 78  Temp: 36.8 C  Resp: 15    Post-op Vital Signs: stable   Complications: No apparent anesthesia complications Patients Blood pressure in looking at records overnight was elevated with even systolics >786. Bladder emptied, pain fully treated so will now give labetalol for goal systolic <754. Patient may be undiagnosed chronic hypertensive given morbid obesity and large circulating blood volume.

## 2014-09-08 NOTE — H&P (Signed)
Justin White is an 39 y.o. male.    Chief Complaint:  Abdominal pain   HPI: 39 y/o seen by Dr. Benson Norway with abdominal pain and gas. History dates back about 2 years, with pain up to 3 hours after eating.  He was treated with PPI's which worked some.  Symptoms more persistent;  [place] ordered on 09/01/14.  Within the gallbladder, there are multiple echogenic foci which move and shadow consistent with gallstones. Largest gallstone measures 6 mm in length. There is intermittent sludge. Gallbladder wall is upper normal in thickness. There is no pericholecystic fluid.  Diameter: 6 mm. There is no intrahepatic, common hepatic, or common bile duct dilatation. Hepatic steatosis He presented to the ED 7/23 with ongoing pain and nausea, no vomiting.  Nausea not relieved by Zofran and was to follow up with our office as an Outpatient.He returned last PM with ongoing pain, nausea and now emesis.  He was admitted from the ED by Dr. Hassell Done.  He does have a history of hypertension, that got better with weight loss and he stopped the medicines.  He has regained the weight but is not on BP meds currently. Work up in the ED: Afebrile, BP up to 216/99 on admit, labs including WBC, lipase, and LFT are normal  Past Medical History  Diagnosis Date  . Hypertension   . Allergy   . Chickenpox   . Obesity  Body mass index is 52.46 kg/(m^2).      Past Surgical History  Procedure Laterality Date  . Left knee arthroscopy    . Left shoulder arthroscopy    . Nose surgery      Family History  Problem Relation Age of Onset  . Hypertension     Social History:  reports that he has never smoked. He has never used smokeless tobacco. He reports that he does not drink alcohol or use illicit drugs.  Allergies: No Known Allergies  Medications Prior to Admission  Medication Sig Dispense Refill  . acetaminophen (TYLENOL) 500 MG tablet Take 500 mg by mouth every 6 (six) hours as needed. For pain    . HYDROcodone-acetaminophen  (NORCO/VICODIN) 5-325 MG per tablet Take 1-2 tablets by mouth every 4 (four) hours as needed. 15 tablet 0  . Ondansetron HCl (ZOFRAN PO) Take 1 tablet by mouth as needed (nausea).    Marland Kitchen omeprazole (PRILOSEC) 20 MG capsule Take 1 capsule (20 mg total) by mouth daily. (Patient not taking: Reported on 09/04/2014) 21 capsule 0  . sucralfate (CARAFATE) 1 GM/10ML suspension Take 10 mLs (1 g total) by mouth every 6 (six) hours as needed. (Patient not taking: Reported on 09/04/2014) 420 mL 0    Results for orders placed or performed during the hospital encounter of 09/07/14 (from the past 48 hour(s))  Lipase, blood     Status: None   Collection Time: 09/07/14 10:46 PM  Result Value Ref Range   Lipase 25 22 - 51 U/L  Comprehensive metabolic panel     Status: None   Collection Time: 09/07/14 10:46 PM  Result Value Ref Range   Sodium 139 135 - 145 mmol/L   Potassium 4.3 3.5 - 5.1 mmol/L   Chloride 104 101 - 111 mmol/L   CO2 29 22 - 32 mmol/L   Glucose, Bld 99 65 - 99 mg/dL   BUN 11 6 - 20 mg/dL   Creatinine, Ser 1.06 0.61 - 1.24 mg/dL   Calcium 9.0 8.9 - 10.3 mg/dL   Total Protein 7.6 6.5 -  8.1 g/dL   Albumin 3.9 3.5 - 5.0 g/dL   AST 20 15 - 41 U/L   ALT 26 17 - 63 U/L   Alkaline Phosphatase 116 38 - 126 U/L   Total Bilirubin 0.7 0.3 - 1.2 mg/dL   GFR calc non Af Amer >60 >60 mL/min   GFR calc Af Amer >60 >60 mL/min    Comment: (NOTE) The eGFR has been calculated using the CKD EPI equation. This calculation has not been validated in all clinical situations. eGFR's persistently <60 mL/min signify possible Chronic Kidney Disease.    Anion gap 6 5 - 15  CBC     Status: None   Collection Time: 09/07/14 10:46 PM  Result Value Ref Range   WBC 10.2 4.0 - 10.5 K/uL   RBC 5.45 4.22 - 5.81 MIL/uL   Hemoglobin 14.8 13.0 - 17.0 g/dL   HCT 44.1 39.0 - 52.0 %   MCV 80.9 78.0 - 100.0 fL   MCH 27.2 26.0 - 34.0 pg   MCHC 33.6 30.0 - 36.0 g/dL   RDW 13.2 11.5 - 15.5 %   Platelets 189 150 - 400 K/uL   Urinalysis, Routine w reflex microscopic (not at [facility])     Status: Abnormal   Collection Time: 09/07/14 11:42 PM  Result Value Ref Range   Color, Urine YELLOW YELLOW   APPearance CLOUDY (A) CLEAR   Specific Gravity, Urine 1.019 1.005 - 1.030   pH 7.5 5.0 - 8.0   Glucose, UA NEGATIVE NEGATIVE mg/dL   Hgb urine dipstick NEGATIVE NEGATIVE   Bilirubin Urine NEGATIVE NEGATIVE   Ketones, ur NEGATIVE NEGATIVE mg/dL   Protein, ur NEGATIVE NEGATIVE mg/dL   Urobilinogen, UA 1.0 0.0 - 1.0 mg/dL   Nitrite NEGATIVE NEGATIVE   Leukocytes, UA NEGATIVE NEGATIVE    Comment: MICROSCOPIC NOT DONE ON URINES WITH NEGATIVE PROTEIN, BLOOD, LEUKOCYTES, NITRITE, OR GLUCOSE <1000 mg/dL.   No results found.  Review of Systems  Constitutional: Negative.        He says he has gained weight with pain over last 2 years about 100 pounds.  HENT: Negative.   Eyes: Negative.   Respiratory: Negative.   Cardiovascular: Negative.   Gastrointestinal: Positive for heartburn, nausea, vomiting and abdominal pain (pain RUQ). Negative for diarrhea, constipation, blood in stool and melena.  Genitourinary: Negative.   Musculoskeletal: Negative.   Skin: Negative.   Neurological: Negative.   Endo/Heme/Allergies: Negative.   Psychiatric/Behavioral: Negative.     Blood pressure 178/93, pulse 69, temperature 97.9 F (36.6 C), temperature source Oral, resp. rate 18, SpO2 96 %. Physical Exam  Constitutional: He is oriented to person, place, and time. He appears well-developed and well-nourished. No distress.  HENT:  Head: Normocephalic and atraumatic.  Nose: Nose normal.  Eyes: Conjunctivae and EOM are normal. Pupils are equal, round, and reactive to light. Right eye exhibits no discharge. Left eye exhibits no discharge. No scleral icterus.  Neck: Normal range of motion. Neck supple. No JVD present. No tracheal deviation present. No thyromegaly present.  Cardiovascular: Normal rate, regular rhythm, normal heart sounds and  intact distal pulses.   No murmur heard. Respiratory: Effort normal and breath sounds normal. No respiratory distress. He has no wheezes. He has no rales. He exhibits no tenderness.  GI: Soft. Bowel sounds are normal. He exhibits no distension and no mass. There is tenderness (RUQ). There is no rebound and no guarding.  Musculoskeletal: He exhibits no edema or tenderness.  Lymphadenopathy:    He  has no cervical adenopathy.  Neurological: He is alert and oriented to person, place, and time. No cranial nerve deficit.  Skin: Skin is warm and dry. No rash noted. He is not diaphoretic. No erythema. No pallor.  Psychiatric: He has a normal mood and affect. His behavior is normal. Judgment and thought content normal.     Assessment/Plan Symptomatic cholelithiasis Hypertension Obesity BMI 52.4  Plan:  I will see if we can get him on schedule for today for cholecystectomy.  Risk and benefits discussed.  Nyaja Dubuque 09/08/2014, 7:13 AM

## 2014-09-08 NOTE — ED Provider Notes (Signed)
CSN: [id number]     Arrival date & time 09/07/14  2203 History   First MD Initiated Contact with Patient 09/08/14 0025     Chief Complaint  Patient presents with  . Abdominal Pain     (Consider location/radiation/quality/duration/timing/severity/associated sxs/prior Treatment) HPI Comments: Patient is a 39 yo M  PMHx significant for HTN presenting to the ED for recurrent RUQ abdominal pain with nausea and two episodes of non-bloody non-bilious emesis. Patient was seen in the ED 7/20 and diagnosed with symptomatic cholelithiasis without evidence of cholecystitis, pain was controlled until today. Patient has been trying at home norco with control until today. Patient is due to see [facility] on Thursday to discuss cholecystectomy.   Patient is a 39 y.o. male presenting with abdominal pain.  Abdominal Pain Pain location:  RUQ Pain radiates to:  Does not radiate Pain severity:  Severe Onset quality:  Sudden Timing:  Constant Progression:  Worsening Chronicity:  Recurrent Context: not previous surgeries   Relieved by:  Nothing Worsened by:  Nothing tried Ineffective treatments: narcotics. Associated symptoms: nausea and vomiting     Past Medical History  Diagnosis Date  . Hypertension   . Allergy   . Chickenpox   . Obesity   . Obesity    Past Surgical History  Procedure Laterality Date  . Left knee arthroscopy    . Left shoulder arthroscopy    . Nose surgery     Family History  Problem Relation Age of Onset  . Hypertension     History  Substance Use Topics  . Smoking status: Never Smoker   . Smokeless tobacco: Never Used  . Alcohol Use: No    Review of Systems  Gastrointestinal: Positive for nausea, vomiting and abdominal pain.  All other systems reviewed and are negative.     Allergies  Review of patient's allergies indicates no known allergies.  Home Medications   Prior to Admission medications   Medication Sig Start Date End Date Taking?  Authorizing Provider  acetaminophen (TYLENOL) 500 MG tablet Take 500 mg by mouth every 6 (six) hours as needed. For pain   Yes Historical Provider, MD  HYDROcodone-acetaminophen (NORCO/VICODIN) 5-325 MG per tablet Take 1-2 tablets by mouth every 4 (four) hours as needed. 09/04/14  Yes Malvin Johns, MD  Ondansetron HCl (ZOFRAN PO) Take 1 tablet by mouth as needed (nausea).   Yes Historical Provider, MD  omeprazole (PRILOSEC) 20 MG capsule Take 1 capsule (20 mg total) by mouth daily. Patient not taking: Reported on 09/04/2014 03/07/12   Carmin Muskrat, MD  sucralfate (CARAFATE) 1 GM/10ML suspension Take 10 mLs (1 g total) by mouth every 6 (six) hours as needed. Patient not taking: Reported on 09/04/2014 03/07/12   Carmin Muskrat, MD   BP 211/95 mmHg  Pulse 68  Temp(Src) 98.5 F (36.9 C) (Oral)  Resp 20  SpO2 100% Physical Exam  Constitutional: He is oriented to person, place, and time. He appears well-developed and well-nourished. No distress.  HENT:  Head: Normocephalic and atraumatic.  Right Ear: External ear normal.  Left Ear: External ear normal.  Nose: Nose normal.  Eyes: Conjunctivae are normal.  Neck: Neck supple.  Cardiovascular: Normal rate, regular rhythm and normal heart sounds.   Pulmonary/Chest: Effort normal and breath sounds normal.  Abdominal: Soft. Bowel sounds are normal. He exhibits no distension. There is tenderness in the right upper quadrant. There is positive Murphy's sign. There is no rebound and no guarding.  Musculoskeletal: Normal range of motion.  Neurological: He is alert and oriented to person, place, and time.  Skin: Skin is warm and dry. He is not diaphoretic.  Nursing note and vitals reviewed.   ED Course  Procedures (including critical care time) Medications  0.9 %  sodium chloride infusion ( Intravenous New Bag/Given 09/08/14 0125)  ondansetron (ZOFRAN) injection 4 mg (not administered)  HYDROmorphone (DILAUDID) injection 1 mg (not administered)   sodium chloride 0.9 % bolus 1,000 mL (0 mLs Intravenous Stopped 09/08/14 0230)  ondansetron (ZOFRAN) injection 4 mg (4 mg Intravenous Given 09/08/14 0124)  morphine 4 MG/ML injection 4 mg (4 mg Intravenous Given 09/08/14 0124)  cefTRIAXone (ROCEPHIN) 1 g in dextrose 5 % 50 mL IVPB (1 g Intravenous New Bag/Given 09/08/14 0124)    Labs Review Labs Reviewed  URINALYSIS, ROUTINE W REFLEX MICROSCOPIC (NOT AT [facility]) - Abnormal; Notable for the following:    APPearance CLOUDY (*)    All other components within normal limits  LIPASE, BLOOD  COMPREHENSIVE METABOLIC PANEL  CBC    Imaging Review No results found.   EKG Interpretation None      1:13 AM Discussed with Dr. Hassell Done of [facility], recommends admitting patient to [facility] service, control pain, and start IV Abx.   MDM   Final diagnoses:  Symptomatic cholelithiasis    Filed Vitals:   09/08/14 0216  BP: 211/95  Pulse: 68  Temp:   Resp:    Afebrile, NAD, non-toxic appearing, AAOx4.   I have reviewed nursing notes, vital signs, and all lab and all imaging results as noted above.  Patient with recurrent right upper quadrant abdominal pain consistent with symptomatic cholelithiasis. Labs reviewed without acute abnormality. Ultrasound reviewed from previous visit. Case was discussed with [facility] he will admit the patient in prep for likely cholecystectomy in the morning. IV fluids, pain medication, antiemetics and antibiotics will be started. Patient d/w with Dr. Sharol Given, agrees with plan.    Baron Sane, PA-C 09/08/14 0231  Linton Flemings, MD 09/08/14 [phone]

## 2014-09-08 NOTE — Transfer of Care (Signed)
Immediate Anesthesia Transfer of Care Note  Patient: Justin White  Procedure(s) Performed: Procedure(s): LAPAROSCOPIC CHOLECYSTECTOMY (N/A)  Patient Location: PACU  Anesthesia Type:General  Level of Consciousness: awake, alert  and oriented  Airway & Oxygen Therapy: Patient Spontanous Breathing and Patient connected to face mask oxygen  Post-op Assessment: Report given to RN and Post -op Vital signs reviewed and stable  Post vital signs: Reviewed and stable  Last Vitals:  Filed Vitals:   09/08/14 1145  BP: 164/95  Pulse: 69  Temp:   Resp:     Complications: No apparent anesthesia complications

## 2014-09-09 ENCOUNTER — Encounter: Payer: Self-pay | Admitting: General Surgery

## 2014-09-09 ENCOUNTER — Encounter (HOSPITAL_COMMUNITY): Payer: Self-pay | Admitting: General Surgery

## 2014-09-09 LAB — HEPATIC FUNCTION PANEL
ALK PHOS: 118 U/L (ref 38–126)
ALT: 80 U/L — AB (ref 17–63)
AST: 71 U/L — AB (ref 15–41)
Albumin: 3.5 g/dL (ref 3.5–5.0)
BILIRUBIN DIRECT: 0.1 mg/dL (ref 0.1–0.5)
BILIRUBIN TOTAL: 0.5 mg/dL (ref 0.3–1.2)
Indirect Bilirubin: 0.4 mg/dL (ref 0.3–0.9)
TOTAL PROTEIN: 7.1 g/dL (ref 6.5–8.1)

## 2014-09-09 LAB — BASIC METABOLIC PANEL
Anion gap: 7 (ref 5–15)
BUN: 10 mg/dL (ref 6–20)
CALCIUM: 8.6 mg/dL — AB (ref 8.9–10.3)
CO2: 26 mmol/L (ref 22–32)
CREATININE: 0.98 mg/dL (ref 0.61–1.24)
Chloride: 105 mmol/L (ref 101–111)
GFR calc Af Amer: >60 mL/min (ref >60)
GFR calc non Af Amer: >60 mL/min (ref >60)
GLUCOSE: 128 mg/dL — AB (ref 65–99)
Potassium: 4.5 mmol/L (ref 3.5–5.1)
Sodium: 138 mmol/L (ref 135–145)

## 2014-09-09 LAB — CBC
HCT: 41.5 % (ref 39.0–52.0)
HEMOGLOBIN: 14.1 g/dL (ref 13.0–17.0)
MCH: 27 pg (ref 26.0–34.0)
MCHC: 34 g/dL (ref 30.0–36.0)
MCV: 79.3 fL (ref 78.0–100.0)
PLATELETS: 186 10*3/uL (ref 150–400)
RBC: 5.23 MIL/uL (ref 4.22–5.81)
RDW: 13.1 % (ref 11.5–15.5)
WBC: 13.9 10*3/uL — ABNORMAL HIGH (ref 4.0–10.5)

## 2014-09-09 MED ORDER — LISINOPRIL 10 MG PO TABS: 10.0000 mg | ORAL_TABLET | Freq: Every day | ORAL | Status: DC

## 2014-09-09 MED ORDER — OXYCODONE-ACETAMINOPHEN 5-325 MG PO TABS: 1.0000 | ORAL_TABLET | ORAL | Status: DC | PRN

## 2014-09-09 MED ORDER — HYDROCHLOROTHIAZIDE 12.5 MG PO CAPS
12.5000 mg | ORAL_CAPSULE | Freq: Every day | ORAL | Status: DC
  Administered 2014-09-09: 12.5 mg via ORAL
  Filled 2014-09-09: qty 1

## 2014-09-09 MED ORDER — LISINOPRIL 10 MG PO TABS
10.0000 mg | ORAL_TABLET | Freq: Every day | ORAL | Status: DC
  Administered 2014-09-09: 10 mg via ORAL
  Filled 2014-09-09: qty 1

## 2014-09-09 MED ORDER — IBUPROFEN 200 MG PO TABS: 600.0000 mg | ORAL_TABLET | Freq: Four times a day (QID) | ORAL | Status: DC | PRN

## 2014-09-09 MED ORDER — HYDROCHLOROTHIAZIDE 12.5 MG PO CAPS: 12.5000 mg | ORAL_CAPSULE | Freq: Every day | ORAL | Status: DC

## 2014-09-09 MED ORDER — ACETAMINOPHEN 325 MG PO TABS: 650.0000 mg | ORAL_TABLET | Freq: Four times a day (QID) | ORAL | Status: DC | PRN

## 2014-09-09 MED ORDER — IBUPROFEN 200 MG PO TABS: ORAL_TABLET | ORAL | Status: DC

## 2014-09-09 MED ORDER — OXYCODONE-ACETAMINOPHEN 5-325 MG PO TABS
1.0000 | ORAL_TABLET | ORAL | Status: DC | PRN
  Administered 2014-09-09: 1 via ORAL
  Filled 2014-09-09: qty 1

## 2014-09-09 NOTE — Discharge Summary (Signed)
Physician Discharge Summary  Patient ID: Justin White MRN: [id number] DOB/AGE: March 11, 1975 39 y.o.  PCP: Nyoka Cowden, MD   Admit date: 09/07/2014 Discharge date: 09/09/2014  Admission Diagnoses:  Symptomatic cholelithiasis Hypertension  -   Currently untreated Obesity BMI 52.4   Discharge Diagnoses:  Symptomatic cholelithiasis Hypertension Obesity BMI 52.4  Active Problems:   Symptomatic cholelithiasis   PROCEDURES:  S/p laparoscopic cholecystectomy, 09/08/14, Dr. Haydee Monica Course: 39 y/o seen by Dr. Benson Norway with abdominal pain and gas. History dates back about 2 years, with pain up to 3 hours after eating. He was treated with PPI's which worked some. Symptoms more persistent; [place] ordered on 09/01/14. Within the gallbladder, there are multiple echogenic foci which move and shadow consistent with gallstones. Largest gallstone measures 6 mm in length. There is intermittent sludge. Gallbladder wall is upper normal in thickness. There is no pericholecystic fluid. Diameter: 6 mm. There is no intrahepatic, common hepatic, or common bile duct dilatation. Hepatic steatosis He presented to the ED 7/23 with ongoing pain and nausea, no vomiting. Nausea not relieved by Zofran and was to follow up with our office as an Outpatient.He returned last PM with ongoing pain, nausea and now emesis. He was admitted from the ED by Dr. Hassell Done. He does have a history of hypertension, that got better with weight loss and he stopped the medicines. He has regained the weight but is not on BP meds currently. Work up in the ED: Afebrile, BP up to 216/99 on admit, labs including WBC, lipase, and LFT are normal He was seen and taken to the OR on 09/08/14.  He tolerated the procedure well.  We gave him BB and apresoline to handle his elevated BP acutely.  He was started back on low dose lisinopril and Hctz, and I have ask him to see Dr. Burnice Logan next week and keep a log of his BP at home 3-4  times per day.  He has had his diet advanced.  We are going to get him up walking and if OK on PO pain meds send home later today.  Condition on d/c:  Improved    Disposition: 01-Home or Self Care     Medication List    STOP taking these medications        HYDROcodone-acetaminophen 5-325 MG per tablet  Commonly known as:  NORCO/VICODIN     sucralfate 1 GM/10ML suspension  Commonly known as:  CARAFATE     ZOFRAN PO      TAKE these medications        acetaminophen 325 MG tablet  Commonly known as:  TYLENOL  Take 2 tablets (650 mg total) by mouth every 6 (six) hours as needed for mild pain, moderate pain, fever or headache.     hydrochlorothiazide 12.5 MG capsule  Commonly known as:  MICROZIDE  Take 1 capsule (12.5 mg total) by mouth daily.     ibuprofen 200 MG tablet  Commonly known as:  ADVIL,MOTRIN  You can safely take 2-3 every 6 hours as needed.  Use this first and then the Percocet for pain not relieved by this or Tylenol.     lisinopril 10 MG tablet  Commonly known as:  PRINIVIL,ZESTRIL  Take 1 tablet (10 mg total) by mouth daily.     omeprazole 20 MG capsule  Commonly known as:  PRILOSEC  Take 1 capsule (20 mg total) by mouth daily.     oxyCODONE-acetaminophen 5-325 MG per tablet  Commonly known as:  PERCOCET/ROXICET  Take 1-2 tablets by mouth every 4 (four) hours as needed for moderate pain.           Follow-up Information    Follow up with [facility] On 09/28/2014.   Why:  You should have an appointment with the [place] [facility] at 3: 15 PM, the office will call to confirm.  If you don't hear by Monday, call and confirm yourself.   Contact information:   [place] [id number] [phone]      Follow up with Nyoka Cowden, MD.   Specialty:  Internal Medicine   Why:  Call and get them to follow up on your blood pressure.  You have only 30 days, low dose blood pressure medicine, you may  need more.  We will not refill your prescription.   Contact information:   [place] [address] [phone]       Signed: Earnstine Regal 09/09/2014, 12:32 PM

## 2014-09-09 NOTE — Progress Notes (Signed)
Pt DC home with spouse. He was provided education, instructions, and prescriptions. Says he has an understanding. He was transferred out via [facility] accomapnied by [employer]

## 2014-09-09 NOTE — Progress Notes (Signed)
1 Day Post-Op  Subjective: Sitting up in the chair with clears.  Sites look good,  He is asking about a note to be out of work for 3 weeks.   Objective: Vital signs in last 24 hours: Temp:  [98 F (36.7 C)-99 F (37.2 C)] 98 F (36.7 C) (07/28 0517) Pulse Rate:  [69-97] 92 (07/28 0517) Resp:  [12-23] 18 (07/28 0517) BP: (144-196)/(78-107) 163/78 mmHg (07/28 0622) SpO2:  [93 %-100 %] 100 % (07/28 0517) Last BM Date: 09/14/14 Diet: clear Afebrile, BP OK, on apresoline and BB WBC 13.9 Other labs pending No IOC Intake/Output from previous day: 07/27 0701 - 07/28 0700 In: 2837.5 [I.V.:2837.5] Out: 2350 [Urine:2350] Intake/Output this shift: Total I/O In: -  Out: 400 [Urine:400]  General appearance: alert, cooperative and no distress GI: soft sore, sites look good.  Lab Results:   Recent Labs  09/08/14 0923 09/09/14 0730  WBC 7.7 13.9*  HGB 13.8 14.1  HCT 41.6 41.5  PLT 177 186    BMET  Recent Labs  09/07/14 2246 09/08/14 0923  NA 139  --   K 4.3  --   CL 104  --   CO2 29  --   GLUCOSE 99  --   BUN 11  --   CREATININE 1.06 1.00  CALCIUM 9.0  --    PT/INR No results for input(s): LABPROT, INR in the last 72 hours.   Recent Labs Lab 09/04/14 1132 09/07/14 2246  AST 20 20  ALT 26 26  ALKPHOS 105 116  BILITOT 0.7 0.7  PROT 7.6 7.6  ALBUMIN 4.0 3.9     Lipase     Component Value Date/Time   LIPASE 25 09/07/2014 2246     Studies/Results: No results found.  Medications: . heparin  5,000 Units Subcutaneous 3 times per day  . hydrochlorothiazide  12.5 mg Oral Daily  . lisinopril  10 mg Oral Daily    Assessment/Plan Symptomatic cholelithiasis S/p laparoscopic cholecystectomy  Hypertension Obesity BMI 52.4 Antibiotics:  Pre op only DVT:  Heparin/SCD   Plan:  I ordered labs to be sure he was OK, before we restarted his lisinopril and hctz.  He will need to follow up with that as soon as he is discharged.  We will advance diet, mobilize  and see how he does on pain meds.  Hopefully home later today.      Medford Staheli 09/09/2014

## 2014-09-09 NOTE — Discharge Instructions (Signed)
Laparoscopic Cholecystectomy, Care After °Refer to this sheet in the next few weeks. These instructions provide you with information on caring for yourself after your procedure. Your health care provider may also give you more specific instructions. Your treatment has been planned according to current medical practices, but problems sometimes occur. Call your health care provider if you have any problems or questions after your procedure. °WHAT TO EXPECT AFTER THE PROCEDURE °After your procedure, it is typical to have the following: °· Pain at your incision sites. You will be given pain medicines to control the pain. °· Mild nausea or vomiting. This should improve after the first 24 hours. °· Bloating and possibly shoulder pain from the gas used during the procedure. This will improve after the first 24 hours. °HOME CARE INSTRUCTIONS  °· Change bandages (dressings) as directed by your health care provider. °· Keep the wound dry and clean. You may wash the wound gently with soap and water. Gently blot or dab the area dry. °· Do not take baths or use swimming pools or hot tubs for 2 weeks or until your health care provider approves. °· Only take over-the-counter or prescription medicines as directed by your health care provider. °· Continue your normal diet as directed by your health care provider. °· Do not lift anything heavier than 10 pounds (4.5 kg) until your health care provider approves. °· Do not play contact sports for 1 week or until your health care provider approves. °SEEK MEDICAL CARE IF:  °· You have redness, swelling, or increasing pain in the wound. °· You notice yellowish-white fluid (pus) coming from the wound. °· You have drainage from the wound that lasts longer than 1 day. °· You notice a bad smell coming from the wound or dressing. °· Your surgical cuts (incisions) break open. °SEEK IMMEDIATE MEDICAL CARE IF:  °· You develop a rash. °· You have difficulty breathing. °· You have chest pain. °· You  have a fever. °· You have increasing pain in the shoulders (shoulder strap areas). °· You have dizzy episodes or faint while standing. °· You have severe abdominal pain. °· You feel sick to your stomach (nauseous) or throw up (vomit) and this lasts for more than 1 day. °Document Released: 01/29/2005 Document Revised: 11/19/2012 Document Reviewed: 09/10/2012 °ExitCare® Patient Information ©2015 [employer]. This information is not intended to replace advice given to you by your health care provider. Make sure you discuss any questions you have with your health care provider. ° °CCS ______[facility] °LAPAROSCOPIC SURGERY: POST OP INSTRUCTIONS °Always review your discharge instruction sheet given to you by the facility where your surgery was performed. °IF YOU HAVE DISABILITY OR FAMILY LEAVE FORMS, YOU MUST BRING THEM TO THE OFFICE FOR PROCESSING.   °DO NOT GIVE THEM TO YOUR DOCTOR. ° °1. A prescription for pain medication may be given to you upon discharge.  Take your pain medication as prescribed, if needed.  If narcotic pain medicine is not needed, then you may take acetaminophen (Tylenol) or ibuprofen (Advil) as needed. °2. Take your usually prescribed medications unless otherwise directed. °3. If you need a refill on your pain medication, please contact your pharmacy.  They will contact our office to request authorization. Prescriptions will not be filled after 5pm or on week-ends. °4. You should follow a light diet the first few days after arrival home, such as soup and crackers, etc.  Be sure to include lots of fluids daily. °5. Most patients will experience some   swelling and bruising in the area of the incisions.  Ice packs will help.  Swelling and bruising can take several days to resolve.  °6. It is common to experience some constipation if taking pain medication after surgery.  Increasing fluid intake and taking a stool softener (such as Colace) will usually help or prevent this problem  from occurring.  A mild laxative (Milk of Magnesia or Miralax) should be taken according to package instructions if there are no bowel movements after 48 hours. °7. Unless discharge instructions indicate otherwise, you may remove your bandages 24-48 hours after surgery, and you may shower at that time.  You may have steri-strips (small skin tapes) in place directly over the incision.  These strips should be left on the skin for 7-10 days.  If your surgeon used skin glue on the incision, you may shower in 24 hours.  The glue will flake off over the next 2-3 weeks.  Any sutures or staples will be removed at the office during your follow-up visit. °8. ACTIVITIES:  You may resume regular (light) daily activities beginning the next day--such as daily self-care, walking, climbing stairs--gradually increasing activities as tolerated.  You may have sexual intercourse when it is comfortable.  Refrain from any heavy lifting or straining until approved by your doctor. °a. You may drive when you are no longer taking prescription pain medication, you can comfortably wear a seatbelt, and you can safely maneuver your car and apply brakes. °b. RETURN TO WORK:  __________________________________________________________ °9. You should see your doctor in the office for a follow-up appointment approximately 2-3 weeks after your surgery.  Make sure that you call for this appointment within a day or two after you arrive home to insure a convenient appointment time. °10. OTHER INSTRUCTIONS: __________________________________________________________________________________________________________________________ __________________________________________________________________________________________________________________________ °WHEN TO CALL YOUR DOCTOR: °1. Fever over 101.0 °2. Inability to urinate °3. Continued bleeding from incision. °4. Increased pain, redness, or drainage from the incision. °5. Increasing abdominal pain ° °The  clinic staff is available to answer your questions during regular business hours.  Please don’t hesitate to call and ask to speak to one of the nurses for clinical concerns.  If you have a medical emergency, go to the nearest emergency room or call 911.  A surgeon from [facility] is always on call at the hospital. °[address], [address], [place], [place]  [address] ? [address] [address], [place], [place]   [address] °[phone] ? [phone] ? FAX [phone] °Web site: [email or web address] °

## 2014-09-09 NOTE — Op Note (Deleted)
1 Day Post-Op  Subjective: Sitting up in the chair with clears.  Sites look good,  He is asking about a note to be out of work for 3 weeks.   Objective: Vital signs in last 24 hours: Temp:  [98 F (36.7 C)-99 F (37.2 C)] 98 F (36.7 C) (07/28 0517) Pulse Rate:  [69-97] 92 (07/28 0517) Resp:  [12-23] 18 (07/28 0517) BP: (144-196)/(78-107) 163/78 mmHg (07/28 0622) SpO2:  [93 %-100 %] 100 % (07/28 0517) Last BM Date: 09/14/14 Diet: clear Afebrile, BP OK, on apresoline and BB WBC 13.9 Other labs pending No IOC Intake/Output from previous day: 07/27 0701 - 07/28 0700 In: 2837.5 [I.V.:2837.5] Out: 2350 [Urine:2350] Intake/Output this shift: Total I/O In: -  Out: 400 [Urine:400]  General appearance: alert, cooperative and no distress GI: soft sore, sites look good.  Lab Results:   Recent Labs  09/08/14 0923 09/09/14 0730  WBC 7.7 13.9*  HGB 13.8 14.1  HCT 41.6 41.5  PLT 177 186    BMET  Recent Labs  09/07/14 2246 09/08/14 0923  NA 139  --   K 4.3  --   CL 104  --   CO2 29  --   GLUCOSE 99  --   BUN 11  --   CREATININE 1.06 1.00  CALCIUM 9.0  --    PT/INR No results for input(s): LABPROT, INR in the last 72 hours.   Recent Labs Lab 09/04/14 1132 09/07/14 2246  AST 20 20  ALT 26 26  ALKPHOS 105 116  BILITOT 0.7 0.7  PROT 7.6 7.6  ALBUMIN 4.0 3.9     Lipase     Component Value Date/Time   LIPASE 25 09/07/2014 2246     Studies/Results: No results found.  Medications: . heparin  5,000 Units Subcutaneous 3 times per day  . hydrochlorothiazide  12.5 mg Oral Daily  . lisinopril  10 mg Oral Daily    Assessment/Plan Symptomatic cholelithiasis S/p laparoscopic cholecystectomy  Hypertension Obesity BMI 52.4 Antibiotics:  Pre op only DVT:  Heparin/SCD   Plan:  I ordered labs to be sure he was OK, before we restarted his lisinopril and hctz.  He will need to follow up with that as soon as he is discharged.  We will advance diet, mobilize  and see how he does on pain meds.  Hopefully home later today.      Justin White 09/09/2014

## 2014-10-07 ENCOUNTER — Ambulatory Visit (INDEPENDENT_AMBULATORY_CARE_PROVIDER_SITE_OTHER): Payer: 59 | Admitting: Physician Assistant

## 2014-10-07 VITALS — BP 172/98 | HR 74 | Temp 98.4°F | Resp 18 | Ht 69.0 in | Wt 375.0 lb

## 2014-10-07 DIAGNOSIS — I10 Essential (primary) hypertension: Secondary | ICD-10-CM

## 2014-10-07 MED ORDER — LISINOPRIL 20 MG PO TABS: 20.0000 mg | ORAL_TABLET | Freq: Every day | ORAL | Status: DC

## 2014-10-07 MED ORDER — HYDROCHLOROTHIAZIDE 12.5 MG PO CAPS: 12.5000 mg | ORAL_CAPSULE | Freq: Every day | ORAL | Status: DC

## 2014-10-07 NOTE — Progress Notes (Signed)
   Subjective:    Patient ID: Justin White, male    DOB: [birth year], 39 y.o.   MRN: [id number]  HPI Patient presents for hypertension follow up and refill of lisinopril and HCTZ. Had emergency cholecystectomy 09/09/14 and bp was 200/?Marland Kitchen Was started on medication during hospital. Has been compliant with medication and had last 25 lbs since surgery by walking 1 mile daily and modifying how much he is eating. Has not made dietary changes yet, but plans on working on his bread consumption which he eats at every meal and drinks diet soda with most meals. BP at home are generally 160s/80s range. No previous dx of HTN until hospital stay. Denies h/o DM, dyslipidemia, CHF, or CKD. Denies SOB, CP, edema, palpitations, HA, dizziness, or change in vision. NKDA.   Review of Systems  Constitutional: Positive for activity change (increased) and appetite change (decreased). Negative for fever, diaphoresis and fatigue.  Eyes: Negative for visual disturbance.  Respiratory: Negative for cough and shortness of breath.   Cardiovascular: Negative for chest pain, palpitations and leg swelling.  Gastrointestinal: Negative for nausea and vomiting.  Neurological: Negative for dizziness and headaches.       Objective:   Physical Exam  Constitutional: He is oriented to person, place, and time. He appears well-developed and well-nourished. No distress.  Blood pressure 172/98, pulse 74, temperature 98.4 F (36.9 C), temperature source Oral, resp. rate 18, height 5\' 9"  (1.753 m), weight 375 lb (170.099 kg), SpO2 97 %.  HENT:  Head: Normocephalic and atraumatic.  Right Ear: External ear normal.  Left Ear: External ear normal.  Eyes: Conjunctivae are normal. Pupils are equal, round, and reactive to light. Right eye exhibits no discharge. Left eye exhibits no discharge. No scleral icterus.  Neck: Neck supple. No JVD present. Carotid bruit is not present. No thyromegaly present.  Cardiovascular: Normal rate, regular  rhythm and normal heart sounds.  Exam reveals no gallop and no friction rub.   No murmur heard. Pulmonary/Chest: Effort normal and breath sounds normal. No respiratory distress. He has no wheezes. He has no rales.  Abdominal: Soft. Bowel sounds are normal. He exhibits no distension. There is no tenderness. There is no rebound and no guarding.  Limited to body habitus  Musculoskeletal: He exhibits no edema.  Lymphadenopathy:    He has no cervical adenopathy.  Neurological: He is alert and oriented to person, place, and time.  Skin: Skin is warm and dry. No rash noted. He is not diaphoretic. No erythema. No pallor.  Psychiatric: He has a normal mood and affect. His behavior is normal. Judgment and thought content normal.       Assessment & Plan:  1. Essential hypertension Lifestyle modifications discussed focusing on diet. Increased lisinopril from 10 mg to 20 mg. RTC in 4 weeks for BP recheck. - lisinopril (PRINIVIL,ZESTRIL) 20 MG tablet; Take 1 tablet (20 mg total) by mouth daily.  Dispense: 30 tablet; Refill: 1 - hydrochlorothiazide (MICROZIDE) 12.5 MG capsule; Take 1 capsule (12.5 mg total) by mouth daily.  Dispense: 30 capsule; Refill: [place] PA-C  [facility] 10/07/2014 10:26 AM

## 2014-10-07 NOTE — Patient Instructions (Signed)

## 2014-12-20 ENCOUNTER — Other Ambulatory Visit: Payer: Self-pay | Admitting: Physician Assistant

## 2015-04-19 MED FILL — HYDROCODON-APAP 7.5-325: 7.5-325 | 5 days supply | Qty: 20 | Fill #0

## 2015-04-19 MED FILL — AMOXICILLIN 500 MG CAPSULE: 500 | 7 days supply | Qty: 28 | Fill #0

## 2015-05-25 DIAGNOSIS — Z6841 Body Mass Index (BMI) 40.0 and over, adult: Secondary | ICD-10-CM | POA: Diagnosis not present

## 2015-05-25 DIAGNOSIS — Z23 Encounter for immunization: Secondary | ICD-10-CM | POA: Diagnosis not present

## 2015-05-25 DIAGNOSIS — Z0000 Encounter for general adult medical examination without abnormal findings: Secondary | ICD-10-CM | POA: Diagnosis not present

## 2015-05-25 DIAGNOSIS — I10 Essential (primary) hypertension: Secondary | ICD-10-CM | POA: Diagnosis not present

## 2015-05-25 DIAGNOSIS — Z13220 Encounter for screening for lipoid disorders: Secondary | ICD-10-CM | POA: Diagnosis not present

## 2015-05-25 MED FILL — LISINOPRIL-HCTZ 20-25 MG TA: 20-25 | 30 days supply | Qty: 30 | Fill #0

## 2015-05-25 MED FILL — AMLODIPINE BESYLATE 5 MG TA: 5 | 30 days supply | Qty: 30 | Fill #0

## 2015-06-08 DIAGNOSIS — D239 Other benign neoplasm of skin, unspecified: Secondary | ICD-10-CM | POA: Diagnosis not present

## 2015-06-22 DIAGNOSIS — I10 Essential (primary) hypertension: Secondary | ICD-10-CM | POA: Diagnosis not present

## 2015-06-22 DIAGNOSIS — G479 Sleep disorder, unspecified: Secondary | ICD-10-CM | POA: Diagnosis not present

## 2015-06-22 MED FILL — AMLODIPINE BESYLATE 5 MG TA: 5 | 30 days supply | Qty: 30 | Fill #0

## 2015-06-22 MED FILL — LISINOPRIL-HCTZ 20-25 MG TA: 20-25 | 30 days supply | Qty: 30 | Fill #0

## 2015-07-13 DIAGNOSIS — G4726 Circadian rhythm sleep disorder, shift work type: Secondary | ICD-10-CM | POA: Diagnosis not present

## 2015-07-13 DIAGNOSIS — G479 Sleep disorder, unspecified: Secondary | ICD-10-CM | POA: Diagnosis not present

## 2015-07-22 MED FILL — AMLODIPINE BESYLATE 5 MG TA: 5 | 30 days supply | Qty: 30 | Fill #1

## 2015-07-22 MED FILL — LISINOPRIL-HCTZ 20-25 MG TA: 20-25 | 30 days supply | Qty: 30 | Fill #1

## 2015-08-10 ENCOUNTER — Ambulatory Visit (HOSPITAL_BASED_OUTPATIENT_CLINIC_OR_DEPARTMENT_OTHER): Payer: 59 | Attending: Internal Medicine | Admitting: Internal Medicine

## 2015-08-10 VITALS — Ht 70.0 in | Wt 393.0 lb

## 2015-08-10 DIAGNOSIS — R0683 Snoring: Secondary | ICD-10-CM

## 2015-08-10 DIAGNOSIS — G4710 Hypersomnia, unspecified: Secondary | ICD-10-CM

## 2015-08-10 DIAGNOSIS — G4730 Sleep apnea, unspecified: Principal | ICD-10-CM

## 2015-08-10 DIAGNOSIS — R451 Restlessness and agitation: Secondary | ICD-10-CM

## 2015-08-24 ENCOUNTER — Ambulatory Visit (HOSPITAL_BASED_OUTPATIENT_CLINIC_OR_DEPARTMENT_OTHER): Payer: 59 | Attending: Internal Medicine | Admitting: Internal Medicine

## 2015-08-24 DIAGNOSIS — R5383 Other fatigue: Secondary | ICD-10-CM

## 2015-08-24 DIAGNOSIS — Z029 Encounter for administrative examinations, unspecified: Secondary | ICD-10-CM | POA: Diagnosis present

## 2015-08-24 DIAGNOSIS — G4710 Hypersomnia, unspecified: Secondary | ICD-10-CM

## 2015-08-24 DIAGNOSIS — R0683 Snoring: Secondary | ICD-10-CM

## 2015-08-30 MED FILL — LISINOPRIL-HCTZ 20-25 MG TA: 20-25 | 30 days supply | Qty: 30 | Fill #2

## 2015-08-30 MED FILL — AMLODIPINE BESYLATE 5 MG TA: 5 | 30 days supply | Qty: 30 | Fill #2

## 2015-09-03 DIAGNOSIS — R5383 Other fatigue: Secondary | ICD-10-CM | POA: Diagnosis not present

## 2015-09-03 DIAGNOSIS — G4710 Hypersomnia, unspecified: Secondary | ICD-10-CM

## 2015-09-03 DIAGNOSIS — R0683 Snoring: Secondary | ICD-10-CM | POA: Diagnosis not present

## 2015-09-03 NOTE — Procedures (Signed)
   Patient Name: Justin White, Justin White Date: 08/24/2015 Gender: Male D.O.B: 07-17-1975 Age (years): 40 Referring Provider: Carlena Sax Height (inches): 37 Interpreting Physician: Baird Lyons MD, ABSM Weight (lbs): 393 RPSGT: Jacolyn Reedy BMI: 56 MRN: [id number] Neck Size: 22.00 CLINICAL INFORMATION Sleep Study Type: unattended HST   Indication for sleep study: Excessive Daytime Sleepiness, Fatigue, Snoring   Epworth Sleepiness Score: 3/24  SLEEP STUDY TECHNIQUE A multi-channel overnight portable sleep study was performed. The channels recorded were: nasal airflow, thoracic respiratory movement, and oxygen saturation with a pulse oximetry. Snoring was also monitored.  MEDICATIONS Patient self administered medications include: No medication reported during this study.  SLEEP ARCHITECTURE Patient was studied for 451.9 minutes, recording 10:14 PM until 6:15 AM. The sleep efficiency was 95.0 % and the patient was supine for 72.4%. The arousal index was 0.0 per hour.  RESPIRATORY PARAMETERS The overall AHI was 14.1 per hour, with a central apnea index of 0.0 per hour. The oxygen nadir was 74% during sleep.  CARDIAC DATA Mean heart rate during sleep was 68.9 bpm.  IMPRESSIONS - Mild obstructive sleep apnea occurred during this study (AHI = 14.1/h). - No significant central sleep apnea occurred during this study (CAI = 0.0/h). - Severe oxygen desaturation was noted during this study (Min O2 = 74%). Mean saturation 92%. - Patient snored.  DIAGNOSIS - Obstructive Sleep Apnea (327.23 [G47.33 ICD-10]) - Nocturnal Hypoxemia (327.26 [G47.36 ICD-10])  RECOMMENDATIONS - Therapeutic CPAP titration to determine optimal pressure required to alleviate sleep disordered breathing. - Positional therapy avoiding supine position during sleep. - Surgical consultation for Uvulopalatopharyngoplasty (UPPP) may be considered. - Oral appliance may be considered. - Avoid alcohol,  sedatives and other CNS depressants that may worsen sleep apnea and disrupt normal sleep architecture. - Sleep hygiene should be reviewed to assess factors that may improve sleep quality. - Weight management and regular exercise should be initiated or continued.  [Electronically signed] 09/03/2015 09:56 AM  Baird Lyons MD, ABSM Diplomate, American Board of Sleep Medicine   NPI: [id number]  [place], American Board of Sleep Medicine  ELECTRONICALLY SIGNED ON:  09/03/2015, 9:48 AM [place] PH: [phone]   FX: [phone] [place]

## 2015-09-08 DIAGNOSIS — H5213 Myopia, bilateral: Secondary | ICD-10-CM | POA: Diagnosis not present

## 2015-09-08 DIAGNOSIS — H52223 Regular astigmatism, bilateral: Secondary | ICD-10-CM | POA: Diagnosis not present

## 2015-09-14 DIAGNOSIS — G4733 Obstructive sleep apnea (adult) (pediatric): Secondary | ICD-10-CM | POA: Diagnosis not present

## 2015-09-21 DIAGNOSIS — I10 Essential (primary) hypertension: Secondary | ICD-10-CM | POA: Diagnosis not present

## 2015-09-21 MED FILL — AMLODIPINE BESYLATE 10 MG T: 10 | 90 days supply | Qty: 90 | Fill #0

## 2015-09-29 MED FILL — LISINOPRIL-HCTZ 20-25 MG TA: 20-25 | 30 days supply | Qty: 30 | Fill #3

## 2015-10-15 DIAGNOSIS — G4733 Obstructive sleep apnea (adult) (pediatric): Secondary | ICD-10-CM | POA: Diagnosis not present

## 2015-10-19 DIAGNOSIS — I10 Essential (primary) hypertension: Secondary | ICD-10-CM | POA: Diagnosis not present

## 2015-11-02 MED FILL — LISINOPRIL-HCTZ 20-25 MG TA: 20-25 | 30 days supply | Qty: 30 | Fill #4

## 2015-11-09 DIAGNOSIS — G4733 Obstructive sleep apnea (adult) (pediatric): Secondary | ICD-10-CM | POA: Diagnosis not present

## 2015-11-14 DIAGNOSIS — G4733 Obstructive sleep apnea (adult) (pediatric): Secondary | ICD-10-CM | POA: Diagnosis not present

## 2015-12-07 MED FILL — LISINOPRIL-HCTZ 20-25 MG TA: 20-25 | 30 days supply | Qty: 30 | Fill #5

## 2015-12-15 DIAGNOSIS — G4733 Obstructive sleep apnea (adult) (pediatric): Secondary | ICD-10-CM | POA: Diagnosis not present

## 2015-12-20 DIAGNOSIS — G4733 Obstructive sleep apnea (adult) (pediatric): Secondary | ICD-10-CM | POA: Diagnosis not present

## 2015-12-29 MED FILL — AMLODIPINE BESYLATE 10 MG T: 10 | 90 days supply | Qty: 90 | Fill #1

## 2016-01-09 MED FILL — LISINOPRIL-HCTZ 20-25 MG TA: 20-25 | 90 days supply | Qty: 90 | Fill #0

## 2016-01-14 DIAGNOSIS — G4733 Obstructive sleep apnea (adult) (pediatric): Secondary | ICD-10-CM | POA: Diagnosis not present

## 2016-03-28 MED FILL — AMLODIPINE BESYLATE 10 MG T: 10 | 90 days supply | Qty: 90 | Fill #0

## 2016-04-03 MED FILL — LISINOPRIL-HCTZ 20-25 MG TA: 20-25 | 90 days supply | Qty: 90 | Fill #1

## 2016-05-08 DIAGNOSIS — J40 Bronchitis, not specified as acute or chronic: Secondary | ICD-10-CM | POA: Diagnosis not present

## 2016-05-08 DIAGNOSIS — J45909 Unspecified asthma, uncomplicated: Secondary | ICD-10-CM | POA: Diagnosis not present

## 2016-05-08 DIAGNOSIS — R030 Elevated blood-pressure reading, without diagnosis of hypertension: Secondary | ICD-10-CM | POA: Diagnosis not present

## 2016-05-08 DIAGNOSIS — J019 Acute sinusitis, unspecified: Secondary | ICD-10-CM | POA: Diagnosis not present

## 2016-05-08 MED FILL — VENTOLIN HFA 90 MCG INHALER: 108 (90 BAS | 25 days supply | Qty: 18 | Fill #0

## 2016-05-08 MED FILL — CLARITHROMYCIN ER 500 MG TA: 500 | 10 days supply | Qty: 20 | Fill #0

## 2016-05-31 DIAGNOSIS — I10 Essential (primary) hypertension: Secondary | ICD-10-CM | POA: Diagnosis not present

## 2016-06-25 MED FILL — LISINOPRIL-HCTZ 20-25 MG TA: 20-25 | 90 days supply | Qty: 90 | Fill #0

## 2016-06-25 MED FILL — AMLODIPINE BESYLATE 10 MG T: 10 | 90 days supply | Qty: 90 | Fill #0

## 2016-11-08 MED FILL — AMLODIPINE BESYLATE 10 MG T: 10 | 90 days supply | Qty: 90 | Fill #1

## 2016-11-08 MED FILL — LISINOPRIL-HCTZ 20-25 MG TA: 20-25 | 90 days supply | Qty: 90 | Fill #1

## 2016-11-14 DIAGNOSIS — G4733 Obstructive sleep apnea (adult) (pediatric): Secondary | ICD-10-CM | POA: Diagnosis not present

## 2016-11-14 DIAGNOSIS — G4726 Circadian rhythm sleep disorder, shift work type: Secondary | ICD-10-CM | POA: Diagnosis not present

## 2016-11-14 DIAGNOSIS — Z23 Encounter for immunization: Secondary | ICD-10-CM | POA: Diagnosis not present

## 2016-11-14 DIAGNOSIS — I10 Essential (primary) hypertension: Secondary | ICD-10-CM | POA: Diagnosis not present

## 2016-11-14 DIAGNOSIS — Z13220 Encounter for screening for lipoid disorders: Secondary | ICD-10-CM | POA: Diagnosis not present

## 2016-11-14 DIAGNOSIS — Z0000 Encounter for general adult medical examination without abnormal findings: Secondary | ICD-10-CM | POA: Diagnosis not present

## 2016-11-14 DIAGNOSIS — Z125 Encounter for screening for malignant neoplasm of prostate: Secondary | ICD-10-CM | POA: Diagnosis not present

## 2016-11-19 MED FILL — METOPROLOL SUCC ER 50 MG TA: 50 | 30 days supply | Qty: 30 | Fill #0

## 2016-12-18 MED FILL — METOPROLOL SUCC ER 50 MG TA: 50 | 30 days supply | Qty: 30 | Fill #1

## 2017-01-18 MED FILL — METOPROLOL SUCC ER 50 MG TA: 50 | 30 days supply | Qty: 30 | Fill #2

## 2017-03-06 MED FILL — AMLODIPINE BESYLATE 10 MG T: 10 | 90 days supply | Qty: 90 | Fill #0

## 2017-03-06 MED FILL — METOPROLOL SUCC ER 50 MG TA: 50 | 30 days supply | Qty: 30 | Fill #3

## 2017-07-18 MED FILL — LISINOPRIL-HCTZ 20-25 MG TA: 20-25 | 90 days supply | Qty: 90 | Fill #0

## 2017-07-18 MED FILL — METOPROLOL SUCCINATE ER 50: 50 | 90 days supply | Qty: 90 | Fill #0

## 2017-07-18 MED FILL — AMLODIPINE BESYLATE 10 MG T: 10 | 90 days supply | Qty: 90 | Fill #0

## 2017-07-25 MED FILL — VALSARTAN-HCTZ 320-25 MG TA: 320-25 | 90 days supply | Qty: 90 | Fill #0

## 2017-10-23 MED FILL — METOPROLOL SUCCINATE ER 50: 50 | 90 days supply | Qty: 90 | Fill #1

## 2017-10-23 MED FILL — AMLODIPINE BESYLATE 10 MG T: 10 | 90 days supply | Qty: 90 | Fill #1

## 2017-10-23 MED FILL — VALSARTAN-HCTZ 320-25 MG TA: 320-25 | 90 days supply | Qty: 90 | Fill #1

## 2018-02-13 MED FILL — METOPROLOL SUCCINATE ER 50: 50 | 90 days supply | Qty: 90 | Fill #0

## 2018-02-13 MED FILL — AMLODIPINE BESYLATE 10 MG T: 10 | 90 days supply | Qty: 90 | Fill #0

## 2018-02-13 MED FILL — VALSARTAN-HCTZ 320-25 MG TA: 320-25 | 90 days supply | Qty: 90 | Fill #0

## 2018-05-28 MED FILL — METOPROLOL SUCC ER 50 MG TA: 50 | 90 days supply | Qty: 90 | Fill #1

## 2018-05-29 MED FILL — VALSARTAN-HCTZ 320-25 MG TA: 320-25 | 30 days supply | Qty: 30 | Fill #1

## 2018-05-29 MED FILL — AMLODIPINE BESYLATE 10 MG T: 10 | 90 days supply | Qty: 90 | Fill #1

## 2018-07-23 MED FILL — VALSARTAN-HCTZ 320-25 MG TA: 320-25 | 30 days supply | Qty: 30 | Fill #2

## 2018-08-28 MED FILL — VALSARTAN-HCTZ 320-25 MG TA: 320-25 | 30 days supply | Qty: 30 | Fill #3

## 2018-09-12 MED FILL — AMLODIPINE BESYLATE 10 MG T: 10 | 90 days supply | Qty: 90 | Fill #0

## 2018-09-12 MED FILL — METOPROLOL SUCCINATE ER 50: 50 | 90 days supply | Qty: 90 | Fill #0

## 2018-10-08 MED FILL — VALSARTAN-HCTZ 320-25 MG TA: 320-25 | 30 days supply | Qty: 30 | Fill #0

## 2018-11-19 MED FILL — VALSARTAN-HCTZ 320-25 MG TA: 320-25 | 30 days supply | Qty: 30 | Fill #1

## 2018-12-17 MED FILL — CYCLOBENZAPRINE HCL 10 MG T: 10 | 30 days supply | Qty: 30 | Fill #0

## 2018-12-17 MED FILL — METOPROLOL SUCCINATE ER 50: 50 | 90 days supply | Qty: 90 | Fill #0

## 2018-12-17 MED FILL — AMLODIPINE BESYLATE 10 MG T: 10 | 90 days supply | Qty: 90 | Fill #0

## 2018-12-17 MED FILL — VALSARTAN-HCTZ 320-25 MG TA: 320-25 | 30 days supply | Qty: 30 | Fill #0

## 2018-12-18 ENCOUNTER — Ambulatory Visit
Admission: RE | Admit: 2018-12-18 | Discharge: 2018-12-18 | Disposition: A | Discharge status: Home / Self Care | Payer: 59 | Source: Ambulatory Visit | Attending: Family Medicine | Admitting: Family Medicine

## 2018-12-18 ENCOUNTER — Other Ambulatory Visit: Payer: Self-pay | Admitting: Family Medicine

## 2018-12-18 ENCOUNTER — Other Ambulatory Visit: Payer: Self-pay

## 2018-12-18 DIAGNOSIS — M542 Cervicalgia: Secondary | ICD-10-CM

## 2019-01-21 MED FILL — VALSARTAN-HCTZ 320-25 MG TA: 320-25 | 30 days supply | Qty: 30 | Fill #1

## 2019-03-04 MED FILL — CHOLESTYRAMINE PACKET: 4 | 30 days supply | Qty: 30 | Fill #0

## 2019-03-05 ENCOUNTER — Other Ambulatory Visit (HOSPITAL_COMMUNITY): Payer: Self-pay | Admitting: General Surgery

## 2019-03-05 ENCOUNTER — Other Ambulatory Visit: Payer: Self-pay | Admitting: General Surgery

## 2019-03-11 MED FILL — VALSARTAN-HCTZ 320-25 MG TA: 320-25 | 30 days supply | Qty: 30 | Fill #2

## 2019-03-18 ENCOUNTER — Ambulatory Visit (HOSPITAL_COMMUNITY)
Admission: RE | Admit: 2019-03-18 | Discharge: 2019-03-18 | Disposition: A | Discharge status: Home / Self Care | Payer: Managed Care, Other (non HMO) | Source: Ambulatory Visit | Attending: General Surgery | Admitting: General Surgery

## 2019-03-18 ENCOUNTER — Other Ambulatory Visit: Payer: Self-pay

## 2019-03-18 ENCOUNTER — Other Ambulatory Visit (HOSPITAL_COMMUNITY): Payer: Self-pay | Admitting: General Surgery

## 2019-03-18 DIAGNOSIS — Z01818 Encounter for other preprocedural examination: Secondary | ICD-10-CM | POA: Diagnosis present

## 2019-03-19 ENCOUNTER — Encounter: Payer: Managed Care, Other (non HMO) | Attending: General Surgery | Admitting: Skilled Nursing Facility1

## 2019-03-19 ENCOUNTER — Encounter: Payer: Self-pay | Admitting: Skilled Nursing Facility1

## 2019-03-19 DIAGNOSIS — E785 Hyperlipidemia, unspecified: Secondary | ICD-10-CM | POA: Diagnosis not present

## 2019-03-19 DIAGNOSIS — K760 Fatty (change of) liver, not elsewhere classified: Secondary | ICD-10-CM | POA: Insufficient documentation

## 2019-03-19 DIAGNOSIS — Z6841 Body Mass Index (BMI) 40.0 and over, adult: Secondary | ICD-10-CM | POA: Insufficient documentation

## 2019-03-19 DIAGNOSIS — G4733 Obstructive sleep apnea (adult) (pediatric): Secondary | ICD-10-CM | POA: Diagnosis not present

## 2019-03-19 DIAGNOSIS — M25561 Pain in right knee: Secondary | ICD-10-CM | POA: Insufficient documentation

## 2019-03-19 NOTE — Progress Notes (Signed)
Nutrition Assessment for Bariatric Surgery Medical Nutrition Therapy Patient was seen on 03/19/2019 for Pre-Operative Nutrition Assessment. Letter of approval faxed to [facility] bariatric surgery program coordinator on 03/19/2019  Referral stated Supervised Weight Loss (SWL) visits needed: 0  Planned surgery: RYGB Pt expectation of surgery: none Pt expectation of dietitian: none stated     NUTRITION ASSESSMENT   Anthropometrics  Start weight at [facility]: 411 lbs (date: 03/19/2019)  Height: 70 in BMI: 58.97 kg/m2     Clinical  Medical hx: HTN Medications:  Labs:  Notable signs/symptoms:   Lifestyle & Dietary Hx  Pt states he rotates between days and nights every 4 weeks which keeps him from getting proper sleep.  Pt states in 2015 he started logging his food and lost 100 pounds over 1 year. Pt states he gained the weight back when his wife left him. Pt states he works 7am-7pm. Pt states he has been logging again dn eating about 1500 calories daily. Pt states he has been following the serving size.  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  Pt states he has gotten into some new hobbies: model cars, video games, golf and learning the guitar.   24-Hr Dietary Recall First Meal 6:30pm: oatmeal or meal bar or cold cereal Snack 8:30-9: granola bar  Second Meal 10:30-11: sandwich sometimes with chips (counting out the chips) Snack 2pm: cup of fruit or granola bar Third Meal 5:30-6: spaghetti using zucchini noodles  Snack:  Beverages: water with flavoring, diet soda    Estimated Energy Needs Calories: 1800   NUTRITION DIAGNOSIS  Overweight/obesity (Dickenson-3.3) related to past poor dietary habits and physical inactivity as evidenced by patient w/ planned RYGB surgery following dietary guidelines for continued weight loss.    NUTRITION INTERVENTION  Nutrition counseling (C-1) and education (E-2) to facilitate bariatric surgery goals.   Pre-Op Goals  Reviewed with the Patient . Track food and beverage intake (pen and paper, MyFitness Pal, Baritastic app, etc.) . Make healthy food choices while monitoring portion sizes . Consume 3 meals per day or try to eat every 3-5 hours . Avoid concentrated sugars and fried foods . Keep sugar & fat in the single digits per serving on food labels . Practice CHEWING your food (aim for applesauce consistency) . Practice not drinking 15 minutes before, during, and 30 minutes after each meal and snack . Avoid all carbonated beverages (ex: soda, sparkling beverages)  . Limit caffeinated beverages (ex: coffee, tea, energy drinks) . Avoid all sugar-sweetened beverages (ex: regular soda, sports drinks)  . Avoid alcohol  . Aim for 64-100 ounces of FLUID daily (with at least half of fluid intake being plain water)  . Aim for at least 60-80 grams of PROTEIN daily . Look for a liquid protein source that contains ?15 g protein and ?5 g carbohydrate (ex: shakes, drinks, shots) . Make a list of non-food related activities . Physical activity is an important part of a healthy lifestyle so keep it moving! The goal is to reach 150 minutes of exercise per week, including cardiovascular and weight baring activity.  *Goals that are bolded indicate the pt would like to start working towards these  Handouts Provided Include  . Bariatric Surgery handouts (Nutrition Visits, Pre-Op Goals, Protein Shakes, Vitamins & Minerals)  Learning Style & Readiness for Change Teaching method utilized: Visual & Auditory  Demonstrated degree of understanding via: Teach Back  Barriers to learning/adherence to lifestyle change: none identified   RD's Notes for  Next Visit      MONITORING & EVALUATION Dietary intake, weekly physical activity, body weight, and pre-op goals reached at next nutrition visit.    Next Steps  Patient is to follow up at [place] for Pre-Op Class >2 weeks before surgery for further nutrition education.

## 2019-04-09 ENCOUNTER — Ambulatory Visit (INDEPENDENT_AMBULATORY_CARE_PROVIDER_SITE_OTHER): Payer: 59 | Admitting: Psychology

## 2019-04-09 DIAGNOSIS — F509 Eating disorder, unspecified: Secondary | ICD-10-CM | POA: Diagnosis not present

## 2019-04-22 ENCOUNTER — Ambulatory Visit (INDEPENDENT_AMBULATORY_CARE_PROVIDER_SITE_OTHER): Payer: 59 | Admitting: Psychology

## 2019-04-22 DIAGNOSIS — F509 Eating disorder, unspecified: Secondary | ICD-10-CM

## 2019-04-22 MED FILL — AMLODIPINE BESYLATE 10 MG T: 10 | 90 days supply | Qty: 90 | Fill #1

## 2019-04-22 MED FILL — VALSARTAN-HCTZ 320-25 MG TA: 320-25 | 30 days supply | Qty: 30 | Fill #3

## 2019-04-22 MED FILL — METOPROLOL SUCCINATE ER 50: 50 | 90 days supply | Qty: 90 | Fill #1

## 2019-05-01 ENCOUNTER — Other Ambulatory Visit: Payer: Self-pay

## 2019-05-01 ENCOUNTER — Encounter: Payer: Managed Care, Other (non HMO) | Attending: General Surgery | Admitting: Dietician

## 2019-05-01 VITALS — Ht 69.0 in | Wt >= 6400 oz

## 2019-05-01 DIAGNOSIS — G4733 Obstructive sleep apnea (adult) (pediatric): Secondary | ICD-10-CM | POA: Insufficient documentation

## 2019-05-01 DIAGNOSIS — M25561 Pain in right knee: Secondary | ICD-10-CM | POA: Diagnosis not present

## 2019-05-01 DIAGNOSIS — E785 Hyperlipidemia, unspecified: Secondary | ICD-10-CM | POA: Insufficient documentation

## 2019-05-01 DIAGNOSIS — K760 Fatty (change of) liver, not elsewhere classified: Secondary | ICD-10-CM | POA: Diagnosis not present

## 2019-05-01 DIAGNOSIS — Z6841 Body Mass Index (BMI) 40.0 and over, adult: Secondary | ICD-10-CM | POA: Insufficient documentation

## 2019-05-01 NOTE — Progress Notes (Signed)
Pre-Operative Nutrition Class:  Appt start time: 0900   End time:  1100.  Patient was seen on 05/01/19 for Pre-Operative Bariatric Surgery Education at [facility].   Surgery date: TBD  Surgery type: RYGB Start weight at [facility]: 411lbs Weight today: 404.3lbs  InBody  BODY COMP RESULTS    BMI (kg/m^2) 59.7  Fat Mass (lbs) 218.9  Dry Lean Mass (lbs) 48.1  Total Body Water (lbs) 137.4   Samples given per MNT protocol. Patient educated on appropriate usage: Celebrate Vitamins Multivitamin  Lot # [id number] ,  Exp: 04/2020;  Lot# [id number], Exp: 05/2020; 6063K  Exp: 12/2019; Lot# [id number], Exp: 12/2019  Celebrate Vitamins Calcium Citrate   Lot # [id number], Exp: 10/2019; Lot# [id number], Exp: 07/2019; Lot#: [id number], Exp: 08/2019; Lot# [id number], Exp: 09/2019; Lot# [id number], Exp: 08/2019; Lot# [id number], Exp: 08/2019; Lot# [id number], Exp: 12/2019  Renee Pain Protein Powder   Lot # [id number], Exp: 07/2019; Lot#: [id number], Exp: 07/2019; Lot#: [id number], Exp: 07/2019  Premier Protein Shake   Lot# [id number], Exp: 10/11/19   The following the learning objectives were met by the patient during this course:  Identify Pre-Op Dietary Goals and will begin 2 weeks pre-operatively  Identify appropriate sources of fluids and proteins   State protein recommendations and appropriate sources pre and post-operatively  Identify Post-Operative Dietary Goals and will follow for 2 weeks post-operatively  Identify appropriate multivitamin and calcium sources  Describe the need for physical activity post-operatively and will follow MD recommendations  State when to call healthcare provider regarding medication questions or post-operative complications  Handouts given during class include:  Pre-Op Bariatric Surgery Diet Handout  Protein Shake Handout  Post-Op Bariatric Surgery Nutrition Handout  [facility]EL[facility] Program Information Flyer  Support Group Information Flyer  [facility] Bariatric Supplements Price  List  Follow-Up Plan: Patient will follow-up at [place], at about 2 weeks post operatively for diet advancement per MD.

## 2019-05-06 ENCOUNTER — Ambulatory Visit: Payer: Self-pay | Admitting: General Surgery

## 2019-05-06 NOTE — H&P (Signed)
Justin White Documented: 05/06/2019 8:57 AM Location: [facility] Patient #: [id number] DOB: 09/19/1975 Married / Language: Justin White / Race: White Male  History of Present Illness Justin White Dec MD; 05/06/2019 9:26 AM) The patient is a 44 year old male who presents for a bariatric surgery evaluation. He comes in for additional follow-up regarding his severe obesity and every discuss weight loss surgery. He has completed his weight loss surgery pathway. He denies any medical changes since he was seen in January. He denies any trips to the emergency room or hospital. He denies any new medical diagnoses. He is still compliant with his blood pressure medication. He denies any chest pain, headaches, blurry vision or peripheral edema. He denies any chest tightness or angina. He has lost about 15 pounds since his initial visit. His hemoglobin A1c was normal at 5.2. Vitamin levels were also normal. Chest x-ray and upper GI were unremarkable. His EKG was unchanged since last recording. He has worked with dietitians over the past few months.   02/2019 He is referred by Justin Drivers PA-C for evaluation of weight loss surgery. He completed our seminar on line. He is specifically interested in a gastric bypass. He is interested in improving his health. Despite numerous attempts for sustained weight loss he has not had long-term success. He has tried Weight Watchers on several occasions, Slim fast on multiple occasions all without any long-term success. He was most successful in 2016 when he tract is calories using my fitness pal and lost 109 pounds but ultimately regained the weight. Right now he is working on food substitution and trying to stick to a 1500-calorie per day diet. He is down about 9 pounds since he saw his primary care team in November.  His comorbidities include hypertension and obstructive sleep apnea and chronic bilateral knee pain  He denies any chest pain, chest  pressure, source of breath, orthopnea, paroxysmal nocturnal dyspnea, dyspnea on exertion, TIAs or amaurosis fugax. He denies any blood clots. He may have some peripheral edema at the end of the work day around his ankles. He states that he had a sleep study several years ago that showed mild sleep apnea but he had some low oxygen levels during the sleep study. He doesn't really have any heartburn or reflux. If he has any heartburn it is generally with spicy food but does not require any over-the-counter medication. He has had a laparoscopic cholecystectomy. He has a daily bowel movement. He states he has generally several loose stools per day. He denies any melena or hematochezia. He denies any dysuria or hematuria. He has chronic bilateral knee pain. He does not have any severe headaches.  He denies any tobacco alcohol or drug use. He works for the [employer] [place] [employer] doing a 12 hour shift  I reviewed Justin White's office note from November 4. He is labs during that visit. He had a normal CBC, normal comprehensive metabolic panel, normal TSH, total cholesterol 169, LDL 104, triglycerides 114 and HDL level 42. TSH level was 1.57  I reviewed Justin White sleep study report from July 2017. Patient had mild obstructive sleep apnea, AHI 14; no significant central sleep apnea occurred, he did have oxygen desaturation during the study  I reviewed Justin White operative note from 2016 which showed fatty liver. he had difficult airway in intubation.   Problem List/Past Medical Justin Hiss M. Justin Pulling, MD; 05/06/2019 9:29 AM) FATTY LIVER (K76.0) POSTCHOLECYSTECTOMY DIARRHEA (R19.7) MILD HYPERLIPIDEMIA (E78.5) SEVERE OBESITY (E66.01) His severe  truncal obesity with a BMI of around 63, fatty liver, make him at increased risk for operative time as well as increased risk of perioperative DVT/pe. We discussed that more than likely he would go home on extended chemical DVT prophylaxis. However I think the  benefits of weight loss surgery outweigh the risk. BILATERAL CHRONIC KNEE PAIN (M25.561, M25.562) OSA (OBSTRUCTIVE SLEEP APNEA) (G47.33)  Past Surgical History Justin Hiss M. Justin Pulling, MD; 05/06/2019 9:29 AM) Gallbladder Surgery - Laparoscopic Knee Surgery Left. Oral Surgery Shoulder Surgery Left. Tonsillectomy  Diagnostic Studies History Justin Hiss M. Justin Pulling, MD; 05/06/2019 9:29 AM) Colonoscopy >10 years ago never  Allergies (Justin A. Owens Shark, [place]; 05/06/2019 8:57 AM) No Known Drug Allergies [09/28/2014]: Allergies Reconciled  Medication History (Justin A. Owens Shark, [place]; 05/06/2019 8:57 AM) Cholestyramine (4GM Packet, 1 (one) Oral daily, Taken starting 03/04/2019) Active. amLODIPine Besylate (10MG  Tablet, Oral) Active. Metoprolol Succinate ER (50MG  Tablet ER 24HR, Oral) Active. Valsartan-hydroCHLOROthiazide (320-25MG  Tablet, Oral) Active. Medications Reconciled  Social History Justin Hiss M. Justin Pulling, MD; 05/06/2019 9:29 AM) Alcohol use Remotely quit alcohol use. Caffeine use Carbonated beverages. No drug use Tobacco use Never smoker.  Family History Justin Hiss M. Justin Pulling, MD; 05/06/2019 9:29 AM) Arthritis Brother, Father, Mother. Breast Cancer Family Members In General. Cancer Family Members In General, Father. Cerebrovascular Accident Family Members In General. Colon Polyps Mother. Hypertension Father, Mother. Kidney Disease Father. Malignant Neoplasm Of Pancreas Family Members In General. Melanoma Family Members In General. Migraine Headache Family Members In General. Respiratory Condition Family Members In General. Thyroid problems Family Members In General.  Other Problems Justin Hiss M. Justin Pulling, MD; 05/06/2019 9:29 AM) Cholelithiasis Kidney Stone HYPERTENSION, ESSENTIAL (I10) S/P LAPAROSCOPIC CHOLECYSTECTOMY (Z90.49) [09/28/2014]:     Review of Systems Justin Hiss M. Justin Thieme MD; 05/06/2019 9:27 AM) All other systems negative  Vitals (Justin White RMA; 05/06/2019  8:58 AM) 05/06/2019 8:58 AM Weight: 404.4 lb Height: 68.5in Body Surface Area: 2.77 m Body Mass Index: 60.59 kg/m  Temp.: 77F  Pulse: 93 (Regular)  BP: 142/86 (Sitting, Left Arm, Standard)        Physical Exam Justin Hiss M. Nadiya Pieratt MD; 05/06/2019 9:27 AM)  The physical exam findings are as follows: Note:severe obesity mainly central  General Mental Status-Alert. General Appearance-Consistent with stated age. Hydration-Well hydrated. Voice-Normal.  Integumentary Note: some b/l LE brawny skin, varicosities  Head and Neck Head-normocephalic, atraumatic with no lesions or palpable masses. Trachea-midline. Thyroid Gland Characteristics - normal size and consistency.  Eye Eyeball - Bilateral-Normal. Sclera/Conjunctiva - Bilateral-No scleral icterus.  Chest and Lung Exam Chest and lung exam reveals -quiet, even and easy respiratory effort with no use of accessory muscles and on auscultation, normal breath sounds, no adventitious sounds and normal vocal resonance. Inspection Chest Wall - Normal. Back - normal.  Breast - Did not examine.  Cardiovascular Cardiovascular examination reveals -normal heart sounds, regular rate and rhythm with no murmurs and normal pedal pulses bilaterally.  Abdomen Inspection Inspection of the abdomen reveals - No Hernias. Skin - Scar - Note: well healed trocar scars. Palpation/Percussion Palpation and Percussion of the abdomen reveal - Soft, Non Tender, No Rebound tenderness, No Rigidity (guarding) and No hepatosplenomegaly. Auscultation Auscultation of the abdomen reveals - Bowel sounds normal.  Peripheral Vascular Upper Extremity Palpation - Pulses bilaterally normal.  Neurologic Neurologic evaluation reveals -alert and oriented x 3 with no impairment of recent or remote memory. Mental Status-Normal.  Neuropsychiatric The patient's mood and affect are described as -normal. Judgment and  Insight-insight is appropriate concerning matters relevant to self.  Musculoskeletal Normal Exam -  Left-Upper Extremity Strength Normal and Lower Extremity Strength Normal. Normal Exam - Right-Upper Extremity Strength Normal and Lower Extremity Strength Normal.  Lymphatic Head & Neck  General Head & Neck Lymphatics: Bilateral - Description - Normal. Axillary - Did not examine. Femoral & Inguinal - Did not examine.    Assessment & Plan Justin Hiss M. Oz Gammel MD; 05/06/2019 9:28 AM)  SEVERE OBESITY (E66.01) Story: His severe truncal obesity with a BMI of around 63, fatty liver, make him at increased risk for operative time as well as increased risk of perioperative DVT/pe. We discussed that more than likely he would go home on extended chemical DVT prophylaxis. However I think the benefits of weight loss surgery outweigh the risk. Impression: The patient meets weight loss surgery criteria. I think the patient would be an acceptable candidate for Laparoscopic Roux-en-Y Gastric bypass.  We briefly rediscussed laparoscopic Roux-en-Y gastric bypass. He declined to go over the actual steps as well as the potential risk and complications since we had previously discussed that in detail about 2 months ago. We reviewed his bariatric workup. He has attended his preoperative education class. We discussed the typical hospitalization. We discussed the typical postoperative issues that we encounter. We discussed the transition from liquids to solid proteins. We discussed the importance of continuing with his preoperative liver shrinking diet. I congratulated him on his weight loss to date. All of his questions were asked and answered  This patient encounter took 55minutes today to perform the following: take history, perform exam, review outside records, interpret imaging, counsel the patient on their diagnosis and document encounter, findings & plan in the EHR  Current Plans Pt Education -  EMW_preopbariatric  FATTY LIVER (K76.0) Impression: Given his central truncal obesity and fatty liver noted during his cholecystectomy as well as on an old abdominal ultrasound from 2016 we discussed the importance of continuing with his preoperative weight loss in order to make surgery technically safer and doable. I congratulated him on his efforts to consistently stick to tracking his calories   MILD HYPERLIPIDEMIA (E78.5)   OSA (OBSTRUCTIVE SLEEP APNEA) (G47.33) Impression: mild. more issues with desat during study   BILATERAL CHRONIC KNEE PAIN (M25.561)  Leighton Ruff. Justin Pulling, MD, FACS General, Bariatric, & Minimally Invasive Surgery [facility], [place]

## 2019-05-21 MED FILL — VALSARTAN-HCTZ 320-25 MG TA: 320-25 | 30 days supply | Qty: 30 | Fill #4

## 2019-07-01 MED FILL — VALSARTAN-HCTZ 320-25 MG TA: 320-25 | 30 days supply | Qty: 30 | Fill #5

## 2019-08-05 ENCOUNTER — Other Ambulatory Visit (HOSPITAL_COMMUNITY): Payer: Self-pay | Admitting: Family Medicine

## 2019-08-05 MED FILL — VALSARTAN-HCTZ 320-25 MG TA: 320-25 | 30 days supply | Qty: 30 | Fill #2

## 2019-09-02 MED FILL — METOPROLOL SUCCINATE ER 50: 50 | 90 days supply | Qty: 90 | Fill #1

## 2019-09-02 MED FILL — AMLODIPINE BESYLATE 10 MG T: 10 | 90 days supply | Qty: 90 | Fill #0

## 2019-09-14 MED FILL — VALSARTAN-HCTZ 320-25 MG TA: 320-25 | 90 days supply | Qty: 90 | Fill #0

## 2019-11-01 ENCOUNTER — Other Ambulatory Visit: Payer: Self-pay

## 2019-11-01 ENCOUNTER — Emergency Department (HOSPITAL_COMMUNITY): Payer: Managed Care, Other (non HMO)

## 2019-11-01 ENCOUNTER — Encounter (HOSPITAL_COMMUNITY): Payer: Self-pay

## 2019-11-01 ENCOUNTER — Emergency Department (HOSPITAL_COMMUNITY)
Admission: EM | Admit: 2019-11-01 | Discharge: 2019-11-01 | Disposition: A | Discharge status: Home / Self Care | Payer: Managed Care, Other (non HMO) | Attending: Emergency Medicine | Admitting: Emergency Medicine

## 2019-11-01 DIAGNOSIS — I10 Essential (primary) hypertension: Secondary | ICD-10-CM | POA: Diagnosis not present

## 2019-11-01 DIAGNOSIS — R042 Hemoptysis: Secondary | ICD-10-CM | POA: Diagnosis present

## 2019-11-01 DIAGNOSIS — Z79899 Other long term (current) drug therapy: Secondary | ICD-10-CM | POA: Diagnosis not present

## 2019-11-01 DIAGNOSIS — U071 COVID-19: Secondary | ICD-10-CM

## 2019-11-01 DIAGNOSIS — J1282 Pneumonia due to coronavirus disease 2019: Secondary | ICD-10-CM | POA: Insufficient documentation

## 2019-11-01 NOTE — ED Triage Notes (Signed)
Patient arrived by [facility] for ongoing cough for several days, just recent positive test for covid and unvaccinated. Patient reports he is now noticing blood in the sputum. NAD.

## 2019-11-01 NOTE — Discharge Instructions (Signed)
If you develop shortness of breath, chest pain, coughing up blood, vomiting, or any other new/concerning symptoms then return to the ER or call 911.

## 2019-11-01 NOTE — ED Provider Notes (Signed)
[facility] EMERGENCY DEPARTMENT Provider Note   CSN: [id number] Arrival date & time: 11/01/19  6195     History No chief complaint on file.   Justin White is a 44 y.o. male.  HPI 44 year old male presents with coughing up blood.  He started having respiratory symptoms on 9/11 and on 9/12 was diagnosed with Covid-19.  He has been doing okay, feeling generally ill with fever, body aches, cough.  He does not have any chest pain or shortness of breath.  This morning he had a particularly strong episode of coughing and while coughing up sputum he noticed there was some blood in it.  He states it was a mild amount.  Called 911.  They had O2 sats in the low 90s so they placed him on oxygen.  He has been in the waiting room for over 6 hours and has not had any further coughing up blood.   Past Medical History:  Diagnosis Date  . Allergy   . Chickenpox   . Difficult intubation    patient is a Grade II view with size 4.0 LoPro video laryngoscope (glidescope), difficult is from ample posterior oropharynx soft tisse, large neck circumference, in addition to very small mouth opening  . GERD (gastroesophageal reflux disease)   . Hypertension   . Obesity   . Obesity     Patient Active Problem List   Diagnosis Date Noted  . Symptomatic cholelithiasis 09/08/2014  . Conjunctivitis of left eye 08/13/2011  . 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 HYPERTENSION NEC 02/19/2008  . NECK PAIN, ACUTE 09/09/2007  . SHINGLES 03/24/2007    Past Surgical History:  Procedure Laterality Date  . CHOLECYSTECTOMY N/A 09/08/2014   Procedure: LAPAROSCOPIC CHOLECYSTECTOMY;  Surgeon: Stark Klein, MD;  Location: [facility]S;  Service: General;  Laterality: N/A;  . left knee arthroscopy    . left shoulder arthroscopy    . NOSE SURGERY         Family History  Problem Relation Age of Onset  . Hypertension Other     Social History   Tobacco Use  . Smoking status: Never Smoker  . Smokeless  tobacco: Never Used  Substance Use Topics  . Alcohol use: No  . Drug use: No    Home Medications Prior to Admission medications   Medication Sig Start Date End Date Taking? Authorizing Provider  acetaminophen (TYLENOL) 325 MG tablet Take 2 tablets (650 mg total) by mouth every 6 (six) hours as needed for mild pain, moderate pain, fever or headache. Patient not taking: Reported on 10/07/2014 09/09/14   Earnstine Regal, PA-C  hydrochlorothiazide (MICROZIDE) 12.5 MG capsule Take 1 capsule (12.5 mg total) by mouth daily. PATIENT NEEDS AN OFFICE VISIT FOR ADDITIONAL REFILLS. 12/22/14   Brewington, Tishira R, PA-C  ibuprofen (ADVIL,MOTRIN) 200 MG tablet You can safely take 2-3 every 6 hours as needed.  Use this first and then the Percocet for pain not relieved by this or Tylenol. Patient not taking: Reported on 10/07/2014 09/09/14   Earnstine Regal, PA-C  lisinopril (PRINIVIL,ZESTRIL) 20 MG tablet Take 1 tablet (20 mg total) by mouth daily. PATIENT NEEDS AN OFFICE VISIT FOR ADDITIONAL REFILLS. 12/22/14   Brewington, Tishira R, PA-C  omeprazole (PRILOSEC) 20 MG capsule Take 1 capsule (20 mg total) by mouth daily. Patient not taking: Reported on 09/04/2014 03/07/12   Carmin Muskrat, MD  oxyCODONE-acetaminophen (PERCOCET/ROXICET) 5-325 MG per tablet Take 1-2 tablets by mouth every 4 (four) hours as needed for moderate pain. Patient  not taking: Reported on 10/07/2014 09/09/14   Earnstine Regal, PA-C    Allergies    Patient has no known allergies.  Review of Systems   Review of Systems  Constitutional: Positive for fever.  Respiratory: Positive for cough. Negative for shortness of breath.   Cardiovascular: Negative for chest pain.  Gastrointestinal: Negative for vomiting.  Musculoskeletal: Positive for myalgias.  All other systems reviewed and are negative.   Physical Exam Updated Vital Signs BP (!) 150/130 (BP Location: Right Arm)   Pulse 96   Temp 99.7 F (37.6 C) (Oral)   Resp 17    SpO2 98%   Physical Exam Vitals and nursing note reviewed.  Constitutional:      General: He is not in acute distress.    Appearance: He is well-developed. He is obese. He is not ill-appearing or diaphoretic.  HENT:     Head: Normocephalic and atraumatic.     Right Ear: External ear normal.     Left Ear: External ear normal.     Nose: Nose normal.  Eyes:     General:        Right eye: No discharge.        Left eye: No discharge.  Cardiovascular:     Rate and Rhythm: Regular rhythm. Tachycardia present.     Heart sounds: Normal heart sounds.     Comments: Heart rate low 100s Pulmonary:     Effort: Pulmonary effort is normal.     Breath sounds: Normal breath sounds. No wheezing, rhonchi or rales.  Abdominal:     Palpations: Abdomen is soft.     Tenderness: There is no abdominal tenderness.  Musculoskeletal:     Cervical back: Neck supple.  Skin:    General: Skin is warm and dry.  Neurological:     Mental Status: He is alert.  Psychiatric:        Mood and Affect: Mood is not anxious.     ED Results / Procedures / Treatments   Labs (all labs ordered are listed, but only abnormal results are displayed) Labs Reviewed - No data to display  EKG None  Radiology DG Chest Portable 1 View  Result Date: 11/01/2019 CLINICAL DATA:  44 year old male with cough.  COVID-19.  Hemoptysis. EXAM: PORTABLE CHEST 1 VIEW COMPARISON:  Chest radiographs 03/18/2019 and earlier. FINDINGS: Portable AP upright view at 1346 hours. Low lung volumes. Asymmetric patchy and confluent bibasilar pulmonary opacity, greater on the left. No pneumothorax. No pleural effusion is evident. Visualized tracheal air column is within normal limits. Paucity of bowel gas. No acute osseous abnormality identified. IMPRESSION: Low lung volumes with asymmetric basilar predominant confluent pulmonary opacity suspicious for COVID-19 pneumonia in this setting. Electronically Signed   By: Genevie Ann M.D.   On: 11/01/2019 14:04     Procedures Procedures (including critical care time)  Medications Ordered in ED Medications - No data to display  ED Course  I have reviewed the triage vital signs and the nursing notes.  Pertinent labs & imaging results that were available during my care of the patient were reviewed by me and considered in my medical decision making (see chart for details).    MDM Rules/Calculators/A&P                          Patient is not ill-appearing.  He has mild tachycardia but no hypoxia.  I ambulated the patient back and forth in the room and he  did not become short of breath or have a drop in his O2 sats below 90.  He feels well enough for home.  While he did have a little bit of blood in his sputum today, this sounds like it was from a mild tear from prolonged severe coughing.  I suspicion that he has PE or other emergent chest pathology is low.  There is some pneumonia on his chest x-ray, which has been personally reviewed.  I offered some labs and supportive care but at this point he is feeling well and would like to go home.  Also discussed monoclonal antibodies but he declines. Follow up with PCP.   LIRON EISSLER was evaluated in Emergency Department on 11/01/2019 for the symptoms described in the history of present illness. He was evaluated in the context of the global COVID-19 pandemic, which necessitated consideration that the patient might be at risk for infection with the SARS-CoV-2 virus that causes COVID-19. Institutional protocols and algorithms that pertain to the evaluation of patients at risk for COVID-19 are in a state of rapid change based on information released by regulatory bodies including the CDC and federal and state organizations. These policies and algorithms were followed during the patient's care in the ED.  Final Clinical Impression(s) / ED Diagnoses Final diagnoses:  Pneumonia due to COVID-19 virus  Hemoptysis    Rx / DC Orders ED Discharge Orders    None        Sherwood Gambler, MD 11/01/19 [phone]

## 2020-01-09 MED FILL — VALSARTAN-HCTZ 320-25 MG TA: 320-25 | 90 days supply | Qty: 90 | Fill #1

## 2020-01-11 ENCOUNTER — Other Ambulatory Visit (HOSPITAL_COMMUNITY): Payer: Self-pay | Admitting: Family Medicine

## 2020-01-11 MED FILL — AMLODIPINE BESYLATE 10 MG T: 10 | 90 days supply | Qty: 90 | Fill #0

## 2020-01-11 MED FILL — METOPROLOL SUCCINATE ER 50: 50 | 90 days supply | Qty: 90 | Fill #0

## 2020-05-27 ENCOUNTER — Other Ambulatory Visit (HOSPITAL_COMMUNITY): Payer: Self-pay

## 2020-05-30 ENCOUNTER — Other Ambulatory Visit (HOSPITAL_COMMUNITY): Payer: Self-pay

## 2020-05-30 MED ORDER — AMLODIPINE BESYLATE 10 MG PO TABS
10.0000 mg | ORAL_TABLET | Freq: Every day | ORAL | 0 refills | Status: DC
  Filled 2020-05-30: qty 90, 90d supply, fill #0

## 2020-05-30 MED ORDER — METOPROLOL SUCCINATE ER 50 MG PO TB24
50.0000 mg | ORAL_TABLET | Freq: Every day | ORAL | 0 refills | Status: DC
  Filled 2020-05-30: qty 90, 90d supply, fill #0

## 2020-05-31 ENCOUNTER — Other Ambulatory Visit (HOSPITAL_COMMUNITY): Payer: Self-pay

## 2020-05-31 MED ORDER — VALSARTAN-HYDROCHLOROTHIAZIDE 320-25 MG PO TABS
ORAL_TABLET | ORAL | 0 refills | Status: DC
  Filled 2020-05-31: qty 90, 90d supply, fill #0

## 2020-06-09 ENCOUNTER — Other Ambulatory Visit (HOSPITAL_COMMUNITY): Payer: Self-pay

## 2020-09-30 ENCOUNTER — Other Ambulatory Visit (HOSPITAL_COMMUNITY): Payer: Self-pay

## 2020-09-30 MED ORDER — VALSARTAN-HYDROCHLOROTHIAZIDE 320-25 MG PO TABS
ORAL_TABLET | ORAL | 1 refills | Status: DC
  Filled 2020-09-30: qty 90, 90d supply, fill #0
  Filled 2021-03-01: qty 90, 90d supply, fill #1

## 2020-09-30 MED ORDER — METOPROLOL SUCCINATE ER 50 MG PO TB24
50.0000 mg | ORAL_TABLET | Freq: Every day | ORAL | 1 refills | Status: DC
  Filled 2020-09-30: qty 90, 90d supply, fill #0
  Filled 2021-03-01: qty 90, 90d supply, fill #1

## 2020-09-30 MED ORDER — AMLODIPINE BESYLATE 10 MG PO TABS
10.0000 mg | ORAL_TABLET | Freq: Every day | ORAL | 1 refills | Status: DC
  Filled 2020-09-30: qty 90, 90d supply, fill #0
  Filled 2021-03-01: qty 90, 90d supply, fill #1

## 2021-03-01 ENCOUNTER — Other Ambulatory Visit (HOSPITAL_COMMUNITY): Payer: Self-pay

## 2021-03-01 IMAGING — CR DG CHEST 1V PORT
1 series · 1 of 1 positions shown · non-contrast
Comparison: Chest radiographs 03/18/2019 and earlier.

CLINICAL DATA: 44-year-old male with cough.  [id number].  Hemoptysis.

EXAM:
PORTABLE CHEST 1 VIEW

[AP]
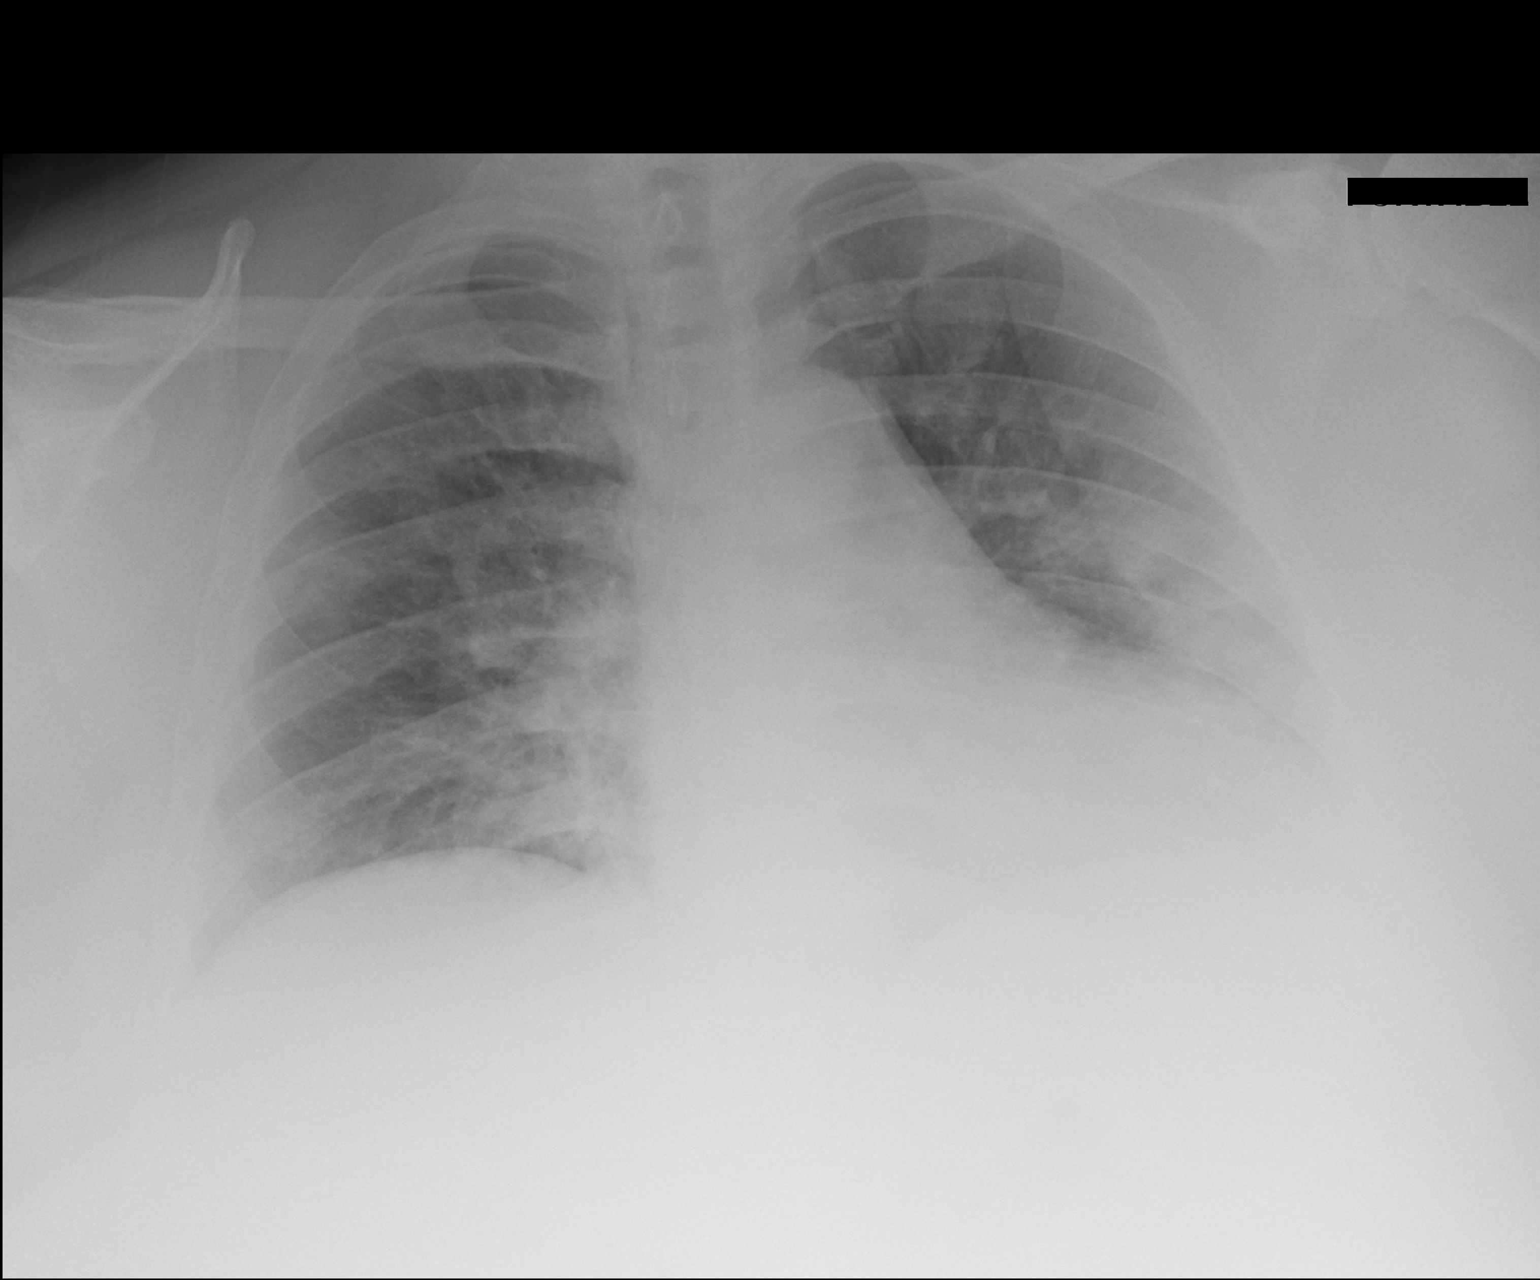

[1 of 1 positions shown; findings below may reference images not displayed]

FINDINGS: Portable AP upright view at 2315 hours. Low lung volumes. Asymmetric
patchy and confluent bibasilar pulmonary opacity, greater on the
left. No pneumothorax. No pleural effusion is evident. Visualized
tracheal air column is within normal limits. Paucity of bowel gas.
No acute osseous abnormality identified.
IMPRESSION: Low lung volumes with asymmetric basilar predominant confluent
pulmonary opacity suspicious for KXZ82-04 pneumonia in this setting.

## 2021-05-26 ENCOUNTER — Other Ambulatory Visit (HOSPITAL_COMMUNITY): Payer: Self-pay

## 2021-05-26 MED ORDER — VALSARTAN-HYDROCHLOROTHIAZIDE 320-25 MG PO TABS
ORAL_TABLET | ORAL | 1 refills | Status: AC
  Filled 2021-05-26: qty 90, 90d supply, fill #0
  Filled 2022-05-03: qty 90, 90d supply, fill #1

## 2021-05-26 MED ORDER — METOPROLOL SUCCINATE ER 50 MG PO TB24
100.0000 mg | ORAL_TABLET | Freq: Every day | ORAL | 1 refills | Status: AC
  Filled 2021-05-26: qty 180, 90d supply, fill #0
  Filled 2022-05-03: qty 180, 90d supply, fill #1

## 2021-05-26 MED ORDER — AMLODIPINE BESYLATE 10 MG PO TABS
10.0000 mg | ORAL_TABLET | Freq: Every day | ORAL | 1 refills | Status: AC
  Filled 2021-05-26: qty 90, 90d supply, fill #0
  Filled 2022-05-03: qty 90, 90d supply, fill #1

## 2021-08-21 ENCOUNTER — Other Ambulatory Visit: Payer: Self-pay | Admitting: Gastroenterology

## 2021-08-25 ENCOUNTER — Other Ambulatory Visit (HOSPITAL_COMMUNITY): Payer: Self-pay

## 2021-08-25 MED ORDER — METOPROLOL SUCCINATE ER 50 MG PO TB24
ORAL_TABLET | ORAL | 1 refills | Status: AC
  Filled 2021-08-25: qty 180, 90d supply, fill #0
  Filled 2022-02-06: qty 180, 90d supply, fill #1

## 2021-08-25 MED ORDER — AMLODIPINE BESYLATE 10 MG PO TABS
ORAL_TABLET | ORAL | 1 refills | Status: AC
  Filled 2021-08-25: qty 90, 90d supply, fill #0
  Filled 2022-02-06: qty 90, 90d supply, fill #1

## 2021-08-25 MED ORDER — VALSARTAN-HYDROCHLOROTHIAZIDE 320-25 MG PO TABS
ORAL_TABLET | ORAL | 1 refills | Status: AC
  Filled 2021-08-25: qty 90, 90d supply, fill #0
  Filled 2022-02-06: qty 90, 90d supply, fill #1

## 2021-08-30 ENCOUNTER — Other Ambulatory Visit (HOSPITAL_COMMUNITY): Payer: Self-pay

## 2021-11-20 ENCOUNTER — Other Ambulatory Visit (HOSPITAL_COMMUNITY): Payer: Self-pay

## 2021-11-20 MED ORDER — PEG 3350-KCL-NA BICARB-NACL 420 G PO SOLR
ORAL | 0 refills | Status: AC
  Filled 2021-11-20: qty 4000, 1d supply, fill #0

## 2021-11-21 ENCOUNTER — Encounter (HOSPITAL_COMMUNITY): Payer: Self-pay | Admitting: Gastroenterology

## 2021-11-21 NOTE — Progress Notes (Signed)
Attempted to obtain medical history via telephone, unable to reach at this time. HIPAA compliant voicemail message left requesting return call to pre surgical testing department. 

## 2021-11-27 NOTE — H&P (Signed)
History of Present Illness  General:         Patient is a 46 year old male, new patient, who presents to set up screening colonoscopy. History of HTN, obesity, OSA.        Labs 05/26/2021 - Normal CBC, CMP        Patient states he had a colonoscopy years ago for evaluation of lower abdominal pain, no findings as far as he remembers and pain found to be musculoskeletal.  He does not remember when or where this was done.        Patient denies GI concerns today.  Denies nausea, vomiting, fever, chills, abdominal pain, dysphagia, reflux, heartburn.  He averages 3-4 bowel movements a day since cholecystectomy 2015. Denies diarrhea, constipation, melena, hematochezia.        Patient denies family history of GI malignancy or disease.  Denies MI/stroke history, is not on any blood thinners, is not diabetic.  Current Medications TakingamLODIPine Besylate 10 MG Tablet 1 tablet Orally Once a day Metoprolol Succinate ER 50 MG Tablet Extended Release 24 Hour 2 tablets Orally Once a day Valsartan-hydroCHLOROthiazide 320-25 MG Tablet 1 tablet Orally Once a day  Medication List reviewed and reconciled with the patient Past Medical History      Hypertension.      Shift work disorder.      obstructive sleep apnea (HSAT 08/24/15 ESS 3, AHI 14/hr, O2 min 74%).      Morbid obesity.  Surgical History       cholecystectomy 2015       tonsillectomy       left shoulder arthroscopy, partial rotator cuff tear       Left knee arthroscopy, meniscus tear       nasal Septoplasty x 2  Family History Father: alive, HTN, RCC Mother: alive, HTN Paternal [place] Father: deceased Paternal [place] Mother: deceased Maternal Grand Father: deceased Maternal Grand Mother: deceased Brother 1: aliveMaternal uncle: diagnosed with CVA1 brother(s) - healthy.  Social History General: Tobacco use     cigarettes:  Never smoked     Tobacco history last updated  08/21/2021     Vaping  None  Alcohol.no Recreational drug  use. Exercise: starting karate 06/2021. Marital Status: single, going through divorce Children: Justin White(20) and Justin White (17)., shared custody with ex-wife.  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  AllergiesN.K.D.A.  Hospitalization/Major Diagnostic ProcedureSee Surgerynot in the past year  Review of SystemsGI PROCEDURE:        Pacemaker/ AICD no.  Artificial heart valves no.  MI/heart attack no.  Abnormal heart rhythm no.  Angina no.  CVA no.  Hypertension YES.  Hypotension no.  Asthma, COPD no.  Sleep apnea no.  Seizure disorders no.  Artificial joints no.  Severe DJD no.  Diabetes no.  Significant headaches no.  Vertigo no.  Depression/anxiety no.  Abnormal bleeding no.  Kidney Disease no.  Liver disease no.  Chance of pregnancy no.  Blood transfusion no  Vital Signs Wt 390.0, Wt change .2 lb, Ht 68.75, BMI 58.01, Temp 96.6, Pulse sitting 61, BP sitting 145/88frsr bp 171/107. Examination Gastroenterology Exam:        GENERAL APPEARANCE: Pleasant, morbidly obese, no acute distress.        RESPIRATORY Breath sounds normal. Respiration even and unlabored.        CARDIOVASCULAR RRR w/o murmurs or gallops. No peripheral edema.  ABDOMEN Soft, obese abdomen, nontender. No masses palpated. Bowel sounds normal.         EXTREMITIES: No edema, pulses intact.         SKIN Warm and dry, good turgor without rashes.         PSYCHIATRIC Alert and oriented x3, mood and affect appear normal..        NEURO: alert, normal strength and tone.   Assessments 1. Screen for colon cancer - Z12.11   Colonoscopy             Scheduled for 11/28/21 at [place] and prep inst given to pt Clinical Notes: Patient is due for screening colonoscopy. I thoroughly discussed the procedure with the patient including but not limited to nature, alternatives, benefits, and risks  (including but not limited to bleeding, infection, perforation, anesthesia/cardiopulmonary complications).  All questions were answered and the patient acknowledges these risks and wishes to proceed with colonoscopy.    2. Others  Clinical Notes: Needs hospital procedure due to BMI >50.

## 2021-11-28 ENCOUNTER — Ambulatory Visit (HOSPITAL_COMMUNITY): Payer: Managed Care, Other (non HMO) | Admitting: Physician Assistant

## 2021-11-28 ENCOUNTER — Encounter (HOSPITAL_COMMUNITY)
Admission: RE | Disposition: A | Discharge status: Home / Self Care | Payer: Self-pay | Source: Home / Self Care | Attending: Gastroenterology

## 2021-11-28 ENCOUNTER — Ambulatory Visit (HOSPITAL_COMMUNITY)
Admission: RE | Admit: 2021-11-28 | Discharge: 2021-11-28 | Disposition: A | Discharge status: Home / Self Care | Payer: Managed Care, Other (non HMO) | Attending: Gastroenterology | Admitting: Gastroenterology

## 2021-11-28 ENCOUNTER — Other Ambulatory Visit: Payer: Self-pay

## 2021-11-28 ENCOUNTER — Encounter (HOSPITAL_COMMUNITY): Payer: Self-pay | Admitting: Gastroenterology

## 2021-11-28 DIAGNOSIS — K648 Other hemorrhoids: Secondary | ICD-10-CM | POA: Diagnosis not present

## 2021-11-28 DIAGNOSIS — K621 Rectal polyp: Secondary | ICD-10-CM

## 2021-11-28 DIAGNOSIS — Z1211 Encounter for screening for malignant neoplasm of colon: Secondary | ICD-10-CM | POA: Diagnosis present

## 2021-11-28 DIAGNOSIS — Z6841 Body Mass Index (BMI) 40.0 and over, adult: Secondary | ICD-10-CM | POA: Diagnosis not present

## 2021-11-28 DIAGNOSIS — K6289 Other specified diseases of anus and rectum: Secondary | ICD-10-CM | POA: Diagnosis not present

## 2021-11-28 DIAGNOSIS — I10 Essential (primary) hypertension: Secondary | ICD-10-CM | POA: Insufficient documentation

## 2021-11-28 DIAGNOSIS — D123 Benign neoplasm of transverse colon: Secondary | ICD-10-CM | POA: Insufficient documentation

## 2021-11-28 DIAGNOSIS — K219 Gastro-esophageal reflux disease without esophagitis: Secondary | ICD-10-CM | POA: Diagnosis not present

## 2021-11-28 DIAGNOSIS — Z79899 Other long term (current) drug therapy: Secondary | ICD-10-CM | POA: Diagnosis not present

## 2021-11-28 DIAGNOSIS — K635 Polyp of colon: Secondary | ICD-10-CM

## 2021-11-28 HISTORY — PX: POLYPECTOMY: SHX5525

## 2021-11-28 HISTORY — PX: COLONOSCOPY WITH PROPOFOL: SHX5780

## 2021-11-28 SURGERY — COLONOSCOPY WITH PROPOFOL
Anesthesia: Monitor Anesthesia Care

## 2021-11-28 MED ORDER — PROPOFOL 500 MG/50ML IV EMUL
INTRAVENOUS | Status: AC
  Filled 2021-11-28: qty 150

## 2021-11-28 MED ORDER — SODIUM CHLORIDE 0.9 % IV SOLN: INTRAVENOUS | Status: DC

## 2021-11-28 MED ORDER — PROPOFOL 1000 MG/100ML IV EMUL
INTRAVENOUS | Status: AC
  Filled 2021-11-28: qty 200

## 2021-11-28 MED ORDER — LACTATED RINGERS IV SOLN: INTRAVENOUS | Status: DC

## 2021-11-28 MED ORDER — PROPOFOL 10 MG/ML IV BOLUS
INTRAVENOUS | Status: DC | PRN
  Administered 2021-11-28 (×2): 20 mg via INTRAVENOUS

## 2021-11-28 MED ORDER — PROPOFOL 500 MG/50ML IV EMUL
INTRAVENOUS | Status: DC | PRN
  Administered 2021-11-28: 125 ug/kg/min via INTRAVENOUS

## 2021-11-28 MED ORDER — PROPOFOL 10 MG/ML IV BOLUS
INTRAVENOUS | Status: AC
  Filled 2021-11-28: qty 20

## 2021-11-28 SURGICAL SUPPLY — 22 items

## 2021-11-28 NOTE — Anesthesia Preprocedure Evaluation (Signed)
Anesthesia Evaluation  Patient identified by MRN, date of birth, ID band Patient awake    Reviewed: Allergy & Precautions, H&P , NPO status , Patient's Chart, lab work & pertinent test results, Unable to perform ROS - Chart review only  History of Anesthesia Complications Negative for: history of anesthetic complications  Airway Mallampati: IV  TM Distance: >3 FB Neck ROM: full   Comment: Very poor, unreassuring airway Dental no notable dental hx.    Pulmonary neg pulmonary ROS,    Pulmonary exam normal breath sounds clear to auscultation       Cardiovascular hypertension, Pt. on medications Normal cardiovascular exam Rhythm:regular Rate:Normal     Neuro/Psych negative neurological ROS  negative psych ROS   GI/Hepatic Neg liver ROS, GERD  ,  Endo/Other  Morbid obesity  Renal/GU negative Renal ROS     Musculoskeletal   Abdominal (+) + obese,   Peds  Hematology negative hematology ROS (+)   Anesthesia Other Findings    Reproductive/Obstetrics negative OB ROS                             Anesthesia Physical  Anesthesia Plan  ASA: III  Anesthesia Plan: MAC   Post-op Pain Management: Minimal or no pain anticipated   Induction: Intravenous  PONV Risk Score and Plan: 1 and Ondansetron and Treatment may vary due to age or medical condition  Airway Management Planned: Simple Face Mask  Additional Equipment:   Intra-op Plan:   Post-operative Plan:   Informed Consent: I have reviewed the patients History and Physical, chart, labs and discussed the procedure including the risks, benefits and alternatives for the proposed anesthesia with the patient or authorized representative who has indicated his/her understanding and acceptance.     Dental Advisory Given  Plan Discussed with: Anesthesiologist and CRNA  Anesthesia Plan Comments:         Anesthesia Quick Evaluation

## 2021-11-28 NOTE — Discharge Instructions (Signed)
YOU HAD AN ENDOSCOPIC PROCEDURE TODAY: Refer to the procedure report and other information in the discharge instructions given to you for any specific questions about what was found during the examination. If this information does not answer your questions, please call [facility] office at [phone] to clarify.   YOU SHOULD EXPECT: Some feelings of bloating in the abdomen. Passage of more gas than usual. Walking can help get rid of the air that was put into your GI tract during the procedure and reduce the bloating. If you had a lower endoscopy (such as a colonoscopy or flexible sigmoidoscopy) you may notice spotting of blood in your stool or on the toilet paper. Some abdominal soreness may be present for a day or two, also.  DIET: Your first meal following the procedure should be a light meal and then it is ok to progress to your normal diet. A half-sandwich or bowl of soup is an example of a good first meal. Heavy or fried foods are harder to digest and may make you feel nauseous or bloated. Drink plenty of fluids but you should avoid alcoholic beverages for 24 hours. If you had a esophageal dilation, please see attached instructions for diet.    ACTIVITY: Your care partner should take you home directly after the procedure. You should plan to take it easy, moving slowly for the rest of the day. You can resume normal activity the day after the procedure however YOU SHOULD NOT DRIVE, use power tools, machinery or perform tasks that involve climbing or major physical exertion for 24 hours (because of the sedation medicines used during the test).   SYMPTOMS TO REPORT IMMEDIATELY: A gastroenterologist can be reached at any hour. Please call [phone]  for any of the following symptoms:  Following lower endoscopy (colonoscopy, flexible sigmoidoscopy) Excessive amounts of blood in the stool  Significant tenderness, worsening of abdominal pains  Swelling of the abdomen that is new, acute  Fever of 100  or higher    FOLLOW UP:  If any biopsies were taken you will be contacted by phone or by letter within the next 1-3 weeks. Call [phone]  if you have not heard about the biopsies in 3 weeks.  Please also call with any specific questions about appointments or follow up tests. YOU HAD AN ENDOSCOPIC PROCEDURE TODAY: Refer to the procedure report and other information in the discharge instructions given to you for any specific questions about what was found during the examination. If this information does not answer your questions, please call [facility] office at [phone] to clarify.   YOU SHOULD EXPECT: Some feelings of bloating in the abdomen. Passage of more gas than usual. Walking can help get rid of the air that was put into your GI tract during the procedure and reduce the bloating. If you had a lower endoscopy (such as a colonoscopy or flexible sigmoidoscopy) you may notice spotting of blood in your stool or on the toilet paper. Some abdominal soreness may be present for a day or two, also.  DIET: Your first meal following the procedure should be a light meal and then it is ok to progress to your normal diet. A half-sandwich or bowl of soup is an example of a good first meal. Heavy or fried foods are harder to digest and may make you feel nauseous or bloated. Drink plenty of fluids but you should avoid alcoholic beverages for 24 hours. If you had a esophageal dilation, please see attached instructions for diet.      ACTIVITY: Your care partner should take you home directly after the procedure. You should plan to take it easy, moving slowly for the rest of the day. You can resume normal activity the day after the procedure however YOU SHOULD NOT DRIVE, use power tools, machinery or perform tasks that involve climbing or major physical exertion for 24 hours (because of the sedation medicines used during the test).   SYMPTOMS TO REPORT IMMEDIATELY: A gastroenterologist can be reached at any hour.  Please call [phone]  for any of the following symptoms:  Following lower endoscopy (colonoscopy, flexible sigmoidoscopy) Excessive amounts of blood in the stool  Significant tenderness, worsening of abdominal pains  Swelling of the abdomen that is new, acute  Fever of 100 or higher    FOLLOW UP:  If any biopsies were taken you will be contacted by phone or by letter within the next 1-3 weeks. Call [phone]  if you have not heard about the biopsies in 3 weeks.  Please also call with any specific questions about appointments or follow up tests.  

## 2021-11-28 NOTE — Anesthesia Postprocedure Evaluation (Signed)
Anesthesia Post Note  Patient: Justin White  Procedure(s) Performed: COLONOSCOPY WITH PROPOFOL POLYPECTOMY     Patient location during evaluation: PACU Anesthesia Type: MAC Level of consciousness: awake and alert Pain management: pain level controlled Vital Signs Assessment: post-procedure vital signs reviewed and stable Respiratory status: spontaneous breathing, nonlabored ventilation and respiratory function stable Cardiovascular status: blood pressure returned to baseline and stable Postop Assessment: no apparent nausea or vomiting Anesthetic complications: no   No notable events documented.  Last Vitals:  Vitals:   11/28/21 1007 11/28/21 1017  BP: 135/66 (!) 146/63  Pulse: 71 65  Resp: 17 16  Temp:    SpO2: 98% 98%    Last Pain:  Vitals:   11/28/21 1017  TempSrc:   PainSc: 0-No pain                 Lynda Rainwater

## 2021-11-28 NOTE — Interval H&P Note (Signed)
History and Physical Interval Note: 46/male for screening colonoscopy with propofol.  11/28/2021 8:32 AM  Justin White  has presented today for colonoscopy, with the diagnosis of Screening.  The various methods of treatment have been discussed with the patient and family. After consideration of risks, benefits and other options for treatment, the patient has consented to  Procedure(s): COLONOSCOPY WITH PROPOFOL (N/A) as a surgical intervention.  The patient's history has been reviewed, patient examined, no change in status, stable for surgery.  I have reviewed the patient's chart and labs.  Questions were answered to the patient's satisfaction.     Ronnette Juniper

## 2021-11-28 NOTE — Op Note (Signed)
[facility] Patient Name: Justin White Procedure Date: 11/28/2021 MRN: [id number] Attending MD: Ronnette Juniper , MD Date of Birth: May 17, 1975 CSN: [id number] Age: 46 Admit Type: Outpatient Procedure:                Colonoscopy Indications:              Screening for colorectal malignant neoplasm, This                            is the patient's first colonoscopy, previously has                            had a flexible sigmoidoscopy Providers:                Ronnette Juniper, MD, Dulcy Fanny, Janee Morn,                            Technician Referring MD:             Erenest Blank Medicines:                Monitored Anesthesia Care Complications:            No immediate complications. Estimated blood loss:                            Minimal. Estimated Blood Loss:     Estimated blood loss was minimal. Procedure:                Pre-Anesthesia Assessment:                           - Prior to the procedure, a History and Physical                            was performed, and patient medications and                            allergies were reviewed. The patient's tolerance of                            previous anesthesia was also reviewed. The risks                            and benefits of the procedure and the sedation                            options and risks were discussed with the patient.                            All questions were answered, and informed consent                            was obtained. Prior Anticoagulants: The patient has                            taken no previous anticoagulant or  antiplatelet                            agents. ASA Grade Assessment: III - A patient with                            severe systemic disease. After reviewing the risks                            and benefits, the patient was deemed in                            satisfactory condition to undergo the procedure.                           After obtaining  informed consent, the colonoscope                            was passed under direct vision. Throughout the                            procedure, the patient's blood pressure, pulse, and                            oxygen saturations were monitored continuously. The                            PCF-HQ190L ([id number]) Olympus colonoscope was                            introduced through the anus and advanced to the the                            terminal ileum. The colonoscopy was performed                            without difficulty. The patient tolerated the                            procedure well. The quality of the bowel                            preparation was fair and poor. Scope In: 9:37:18 AM Scope Out: 9:52:27 AM Scope Withdrawal Time: 0 hours 14 minutes 10 seconds  Total Procedure Duration: 0 hours 15 minutes 9 seconds  Findings:      The perianal and digital rectal examinations were normal.      The terminal ileum appeared normal.      A large amount of liquid stool was found in the entire colon,       interfering with visualization. Lavage of the area was performed,       resulting in incomplete clearance with fair visualization.      A 20 mm polyp was found in the transverse colon. The polyp was sessile.       The polyp was removed with a  piecemeal technique using a hot snare.       Resection and retrieval were complete.      A 7 mm polyp was found in the rectum. The polyp was sessile. The polyp       was removed with a hot snare. Resection and retrieval were complete.      Anal papilla(e) were hypertrophied.      Non-bleeding internal hemorrhoids were found during retroflexion. Impression:               - Preparation of the colon was fair.                           - Preparation of the colon was poor.                           - The examined portion of the ileum was normal.                           - Stool in the entire examined colon.                           - One 20  mm polyp in the transverse colon, removed                            piecemeal using a hot snare. Resected and retrieved.                           - One 7 mm polyp in the rectum, removed with a hot                            snare. Resected and retrieved.                           - Anal papilla(e) were hypertrophied.                           - Non-bleeding internal hemorrhoids. Moderate Sedation:      Patient did not receive moderate sedation for this procedure, but       instead received monitored anesthesia care. Recommendation:           - Patient has a contact number available for                            emergencies. The signs and symptoms of potential                            delayed complications were discussed with the                            patient. Return to normal activities tomorrow.                            Written discharge instructions were provided to the  patient.                           - Resume regular diet.                           - Continue present medications.                           - Await pathology results.                           - Repeat colonoscopy in 1 year(inadequate prep) for                            surveillance based on pathology results. Procedure Code(s):        --- Professional ---                           [phone], Colonoscopy, flexible; with removal of                            tumor(s), polyp(s), or other lesion(s) by snare                            technique Diagnosis Code(s):        --- Professional ---                           Z12.11, Encounter for screening for malignant                            neoplasm of colon                           K64.8, Other hemorrhoids                           K63.5, Polyp of colon                           K62.1, Rectal polyp                           K62.89, Other specified diseases of anus and rectum CPT copyright 2019 [employer]. All rights  reserved. The codes documented in this report are preliminary and upon coder review may  be revised to meet current compliance requirements. Ronnette Juniper, MD 11/28/2021 9:56:51 AM This report has been signed electronically. Number of Addenda: 0

## 2021-11-28 NOTE — Transfer of Care (Signed)
Immediate Anesthesia Transfer of Care Note  Patient: DUC CROCKET  Procedure(s) Performed: COLONOSCOPY WITH PROPOFOL POLYPECTOMY  Patient Location: Endoscopy Unit  Anesthesia Type:MAC  Level of Consciousness: awake, alert  and oriented  Airway & Oxygen Therapy: Patient Spontanous Breathing and Patient connected to face mask oxygen  Post-op Assessment: Report given to RN and Post -op Vital signs reviewed and stable  Post vital signs: Reviewed and stable  Last Vitals:  Vitals Value Taken Time  BP 128/84   Temp    Pulse 81 11/28/21 0958  Resp 18 11/28/21 0958  SpO2 99 % 11/28/21 0958  Vitals shown include unvalidated device data.  Last Pain:  Vitals:   11/28/21 0813  TempSrc: Temporal  PainSc: 0-No pain         Complications: No notable events documented.

## 2021-11-29 LAB — SURGICAL PATHOLOGY

## 2021-12-03 ENCOUNTER — Encounter (HOSPITAL_COMMUNITY): Payer: Self-pay | Admitting: Gastroenterology

## 2022-03-13 ENCOUNTER — Other Ambulatory Visit (HOSPITAL_COMMUNITY): Payer: Self-pay

## 2022-03-13 MED ORDER — TOBRAMYCIN 0.3 % OP SOLN
OPHTHALMIC | 1 refills | Status: AC
  Filled 2022-03-13: qty 5, 10d supply, fill #0
  Filled 2022-03-25: qty 5, 10d supply, fill #1

## 2022-04-13 ENCOUNTER — Other Ambulatory Visit (HOSPITAL_COMMUNITY): Payer: Self-pay

## 2022-04-13 MED ORDER — AMLODIPINE BESYLATE 10 MG PO TABS
10.0000 mg | ORAL_TABLET | Freq: Every day | ORAL | 1 refills | Status: AC
  Filled 2022-04-13 – 2022-06-05 (×2): qty 90, 90d supply, fill #0
  Filled 2022-10-21: qty 90, 90d supply, fill #1

## 2022-04-13 MED ORDER — METOPROLOL SUCCINATE ER 50 MG PO TB24
100.0000 mg | ORAL_TABLET | Freq: Every day | ORAL | 1 refills | Status: AC
  Filled 2022-06-05: qty 180, 90d supply, fill #0
  Filled 2022-10-21: qty 180, 90d supply, fill #1

## 2022-04-13 MED ORDER — VALSARTAN-HYDROCHLOROTHIAZIDE 320-25 MG PO TABS
1.0000 | ORAL_TABLET | Freq: Every day | ORAL | 1 refills | Status: AC
  Filled 2022-04-13 – 2022-06-05 (×3): qty 90, 90d supply, fill #0
  Filled 2022-10-21: qty 90, 90d supply, fill #1

## 2022-04-13 MED ORDER — PREDNISONE 20 MG PO TABS
ORAL_TABLET | ORAL | 0 refills | Status: AC
  Filled 2022-04-13: qty 18, 9d supply, fill #0

## 2022-04-14 ENCOUNTER — Other Ambulatory Visit (HOSPITAL_COMMUNITY): Payer: Self-pay

## 2022-04-17 ENCOUNTER — Emergency Department (HOSPITAL_BASED_OUTPATIENT_CLINIC_OR_DEPARTMENT_OTHER): Payer: Managed Care, Other (non HMO)

## 2022-04-17 ENCOUNTER — Other Ambulatory Visit: Payer: Self-pay

## 2022-04-17 ENCOUNTER — Emergency Department (HOSPITAL_BASED_OUTPATIENT_CLINIC_OR_DEPARTMENT_OTHER)
Admission: EM | Admit: 2022-04-17 | Discharge: 2022-04-17 | Disposition: A | Discharge status: Home / Self Care | Payer: Managed Care, Other (non HMO) | Attending: Emergency Medicine | Admitting: Emergency Medicine

## 2022-04-17 ENCOUNTER — Other Ambulatory Visit (HOSPITAL_BASED_OUTPATIENT_CLINIC_OR_DEPARTMENT_OTHER): Payer: Self-pay

## 2022-04-17 ENCOUNTER — Encounter (HOSPITAL_BASED_OUTPATIENT_CLINIC_OR_DEPARTMENT_OTHER): Payer: Self-pay | Admitting: Radiology

## 2022-04-17 DIAGNOSIS — H579 Unspecified disorder of eye and adnexa: Secondary | ICD-10-CM | POA: Insufficient documentation

## 2022-04-17 DIAGNOSIS — Z79899 Other long term (current) drug therapy: Secondary | ICD-10-CM | POA: Insufficient documentation

## 2022-04-17 DIAGNOSIS — I10 Essential (primary) hypertension: Secondary | ICD-10-CM | POA: Insufficient documentation

## 2022-04-17 MED ORDER — TETRACAINE HCL 0.5 % OP SOLN
2.0000 [drp] | Freq: Once | OPHTHALMIC | Status: AC
  Administered 2022-04-17: 2 [drp] via OPHTHALMIC
  Filled 2022-04-17: qty 4

## 2022-04-17 MED ORDER — DEXAMETHASONE SODIUM PHOSPHATE 10 MG/ML IJ SOLN
10.0000 mg | Freq: Once | INTRAMUSCULAR | Status: AC
  Administered 2022-04-17: 10 mg via INTRAMUSCULAR
  Filled 2022-04-17: qty 1

## 2022-04-17 MED ORDER — DIPHENHYDRAMINE HCL 50 MG/ML IJ SOLN
50.0000 mg | Freq: Once | INTRAMUSCULAR | Status: DC
  Filled 2022-04-17: qty 1

## 2022-04-17 MED ORDER — FLUORESCEIN SODIUM 1 MG OP STRP: 1.0000 | ORAL_STRIP | Freq: Once | OPHTHALMIC | Status: AC

## 2022-04-17 MED ORDER — FLUORESCEIN SODIUM 1 MG OP STRP
ORAL_STRIP | OPHTHALMIC | Status: AC
  Administered 2022-04-17: 1 via OPHTHALMIC
  Filled 2022-04-17: qty 1

## 2022-04-17 MED ORDER — METHYLPREDNISOLONE SODIUM SUCC 125 MG IJ SOLR
125.0000 mg | Freq: Once | INTRAMUSCULAR | Status: DC
  Filled 2022-04-17: qty 2

## 2022-04-17 NOTE — Discharge Instructions (Addendum)
Evaluation for your bilateral eye problem was overall reassuring.  I do feel that given the persistence of your symptoms warrants further evaluation by ophthalmology.  Contact information is provided in your discharge summary.  If you have new vision change, difficulty moving your eyes, new fever, trouble breathing or worsening rash or any other concerning symptom please return emergency department for further evaluation.

## 2022-04-17 NOTE — ED Triage Notes (Addendum)
Pt here for bilateral eye conjunctivitis that has been ongoing for 6 weeks. Pt has seen 5 doctors for this, first one told him it was viral, second one told him it was bacterial and got prescribed eye drops, third doctor told him it was a severe sinus infection and prescribed oral antibiotics, eye drops, z-pack and steroid cream, fourth doctor told him it was an allergic reaction and he is currently on a steroid pack. Pt last saw doctor on Friday. Pt reports HA and red, itchy eyes.

## 2022-04-17 NOTE — ED Notes (Signed)
Patient transported to CT 

## 2022-04-17 NOTE — ED Provider Notes (Signed)
[facility] Provider Note   CSN: [id number] Arrival date & time: 04/17/22  [id number]     History  Chief Complaint  Patient presents with   Eye Problem   HPI Justin White is a 47 y.o. male with hypertension presenting for eye problem.  6 weeks ago started having itchy, redness and watery discharge in both eyes.  It started in the left eye and then symptoms began in the right eye later that day.  Denies visual disturbance but does endorse photophobia.  A week after symptoms was seen by medical provider who diagnosed with viral conjunctivitis.  Symptoms persisted and then was later diagnosed with bacterial conjunctivitis and was prescribed antibiotic topical drops x 2 with no improvement.  Symptoms persisted and by week 3, a third medical provider diagnosed him with sinusitis and attributed his eye symptoms to sinusitis and start him on Z-Pak, topical steroid and oral steroid, and zyrtec. Symptoms have persisted ultimately prompting his evaluation today.   Eye Problem Associated symptoms: discharge, itching, photophobia and redness        Home Medications Prior to Admission medications   Medication Sig Start Date End Date Taking? Authorizing Provider  amLODipine (NORVASC) 10 MG tablet Take 1 tablet (10 mg total) by mouth daily. 05/26/21     amLODipine (NORVASC) 10 MG tablet Take 1 tablet by mouth once daily 08/25/21     amLODipine (NORVASC) 10 MG tablet Take 1 tablet (10 mg total) by mouth daily. 04/13/22     ibuprofen (ADVIL) 200 MG tablet Take 400 mg by mouth every 8 (eight) hours as needed for moderate pain.    [provider]  metoprolol succinate (TOPROL-XL) 50 MG 24 hr tablet Take 2 tablets (100 mg total) by mouth daily. 05/26/21     metoprolol succinate (TOPROL-XL) 50 MG 24 hr tablet Take 2 tablets by mouth once daily 08/25/21     metoprolol succinate (TOPROL-XL) 50 MG 24 hr tablet Take 2 tablets (100 mg total) by mouth daily. 04/13/22      Multiple Vitamins-Minerals (MULTIVITAMIN GUMMIES ADULT PO) Take 2 each by mouth daily.    [provider]  polyethylene glycol-electrolytes (NULYTELY) 420 g solution Take as directed per package instructions. 08/21/21     predniSONE (DELTASONE) 20 MG tablet Take 3 tablets by mouth once a day for 3 days, 2 tablets once a day for 3 days, then 1 tablet once a day for 3 days. 04/13/22     tobramycin (TOBREX) 0.3 % ophthalmic solution INSTILL 1 DROP INTO AFFECTED EYE(S) EVERY 4 HOURS FOR 10 DAYS. 03/13/22     valsartan-hydrochlorothiazide (DIOVAN-HCT) 320-25 MG tablet Take 1 tablet by mouth daily 05/26/21     valsartan-hydrochlorothiazide (DIOVAN-HCT) 320-25 MG tablet Take 1 tablet by mouth once daily 08/25/21     valsartan-hydrochlorothiazide (DIOVAN-HCT) 320-25 MG tablet Take 1 tablet by mouth daily. 04/13/22         Allergies    Patient has no known allergies.    Review of Systems   Review of Systems  Eyes:  Positive for photophobia, discharge, redness and itching.    Physical Exam   Vitals:   04/17/22 1130 04/17/22 1145  BP: (!) 145/80 (!) 144/74  Pulse: 71 71  Resp:    Temp:    SpO2: 95% 94%    CONSTITUTIONAL:  well-appearing, NAD NEURO:  Alert and oriented x 3, CN 3-12 grossly intact EYES:  PERRL, EOM intact, conjunctival erythema, no fluorescein uptake, no evidence  of dendritic lesion, foreign body or corneal abrasion with Sherral Hammers lamp's inspection.  IOP in right eye 22.  IOP in left eye 28.    Visual Acuity  Right Eye Distance: 20/25 Left Eye Distance: 20/25 Bilateral Distance: 20/25  Right Eye Near:   Left Eye Near:    Bilateral Near:     ENT/NECK:  Supple, no stridor CARDIO: appears well-perfused  PULM:  No respiratory distress MSK/SPINE:  No gross deformities, no edema, moves all extremities  SKIN: Erythematous, raised with mask- like distribution rash noted in the periorbital space extending to the eyebrows and down to the upper portions of both  cheeks.   *Additional and/or pertinent findings included in MDM below    ED Results / Procedures / Treatments   Labs (all labs ordered are listed, but only abnormal results are displayed) Labs Reviewed - No data to display  EKG None  Radiology CT Maxillofacial Wo Contrast  Result Date: 04/17/2022 CLINICAL DATA:  congestion EXAM: CT MAXILLOFACIAL WITHOUT CONTRAST TECHNIQUE: Multidetector CT imaging of the maxillofacial structures was performed. Multiplanar CT image reconstructions were also generated. RADIATION DOSE REDUCTION: This exam was performed according to the departmental dose-optimization program which includes automated exposure control, adjustment of the mA and/or kV according to patient size and/or use of iterative reconstruction technique. COMPARISON:  None Available. FINDINGS: Osseous: No fracture or mandibular dislocation. No destructive process. Orbits: Negative. No traumatic or inflammatory finding. Sinuses: Clear. Soft tissues: Negative. Limited intracranial: No significant or unexpected finding. IMPRESSION: Unremarkable maxillofacial CT.  Clear sinuses. Electronically Signed   By: Margaretha Sheffield M.D.   On: 04/17/2022 11:27    Procedures Procedures    Medications Ordered in ED Medications  tetracaine (PONTOCAINE) 0.5 % ophthalmic solution 2 drop (2 drops Both Eyes Given 04/17/22 1039)  fluorescein ophthalmic strip 1 strip (1 strip Both Eyes Given 04/17/22 1039)  dexamethasone (DECADRON) injection 10 mg (10 mg Intramuscular Given 04/17/22 1038)    ED Course/ Medical Decision Making/ A&P                             Medical Decision Making Risk Prescription drug management.   47 year old male who is well-appearing and hemodynamically stable presenting for eye problem.  Exam notable for obvious mask like, erythematous raised rash around his eyes, and subconjunctival redness.  DDx includes viral vs bacterial conjunctivitis, anaphylaxis, sinusitis, corneal abrasion, and  foreign body.  Primarily concerned with possible anaphylaxis, treated with Decadron.  Upon reinspection rash seems subtly improved.  Given his history of sinusitis, thought it warranted more evaluation with CT scan, fortunately CT max face was unremarkable.  Patient stated that his eye itchiness improved after Decadron and take tetracaine.  Advised to continue taking Zyrtec and steroids at home.  Also advised to take Benadryl with symptoms worsen.  Discussed return precautions.  Advised to follow-up with ophthalmology.  Discharged with stable vitals.        Final Clinical Impression(s) / ED Diagnoses Final diagnoses:  Eye problem    Rx / DC Orders ED Discharge Orders     None         Harriet Pho, PA-C 04/17/22 1231    Blanchie Dessert, MD 04/18/22 2222

## 2022-05-03 ENCOUNTER — Other Ambulatory Visit: Payer: Self-pay

## 2022-05-04 ENCOUNTER — Other Ambulatory Visit (HOSPITAL_COMMUNITY): Payer: Self-pay

## 2022-05-04 ENCOUNTER — Encounter (HOSPITAL_COMMUNITY): Payer: Self-pay

## 2022-05-09 ENCOUNTER — Other Ambulatory Visit: Payer: Self-pay

## 2022-05-24 ENCOUNTER — Other Ambulatory Visit: Payer: Self-pay

## 2022-05-24 ENCOUNTER — Other Ambulatory Visit (HOSPITAL_COMMUNITY): Payer: Self-pay

## 2022-05-25 ENCOUNTER — Other Ambulatory Visit: Payer: Self-pay

## 2022-06-04 ENCOUNTER — Other Ambulatory Visit (HOSPITAL_COMMUNITY): Payer: Self-pay

## 2022-06-05 ENCOUNTER — Other Ambulatory Visit: Payer: Self-pay

## 2022-06-05 ENCOUNTER — Other Ambulatory Visit (HOSPITAL_COMMUNITY): Payer: Self-pay

## 2022-10-23 ENCOUNTER — Other Ambulatory Visit (HOSPITAL_COMMUNITY): Payer: Self-pay

## 2023-03-05 ENCOUNTER — Other Ambulatory Visit (HOSPITAL_COMMUNITY): Payer: Self-pay

## 2023-03-05 MED ORDER — DOXYCYCLINE HYCLATE 100 MG PO TABS
100.0000 mg | ORAL_TABLET | Freq: Two times a day (BID) | ORAL | 0 refills | Status: AC
  Filled 2023-03-05: qty 20, 10d supply, fill #0

## 2023-03-05 MED ORDER — PROMETHAZINE-DM 6.25-15 MG/5ML PO SYRP
5.0000 mL | ORAL_SOLUTION | Freq: Four times a day (QID) | ORAL | 0 refills | Status: AC | PRN
  Filled 2023-03-05: qty 200, 10d supply, fill #0

## 2023-03-14 ENCOUNTER — Telehealth: Payer: Self-pay | Admitting: Family Medicine

## 2023-03-14 NOTE — Telephone Encounter (Signed)
error

## 2023-04-01 ENCOUNTER — Other Ambulatory Visit (HOSPITAL_COMMUNITY): Payer: Self-pay

## 2023-04-01 MED ORDER — NEOMYCIN-POLYMYXIN-DEXAMETH 3.5-10000-0.1 OP OINT
TOPICAL_OINTMENT | OPHTHALMIC | 0 refills | Status: AC
  Filled 2023-04-01: qty 3.5, 10d supply, fill #0

## 2023-04-13 ENCOUNTER — Other Ambulatory Visit: Payer: Self-pay

## 2023-04-13 ENCOUNTER — Emergency Department (HOSPITAL_BASED_OUTPATIENT_CLINIC_OR_DEPARTMENT_OTHER)
Admission: EM | Admit: 2023-04-13 | Discharge: 2023-04-13 | Discharge status: Against Medical Advice | Source: Home / Self Care

## 2023-04-16 ENCOUNTER — Other Ambulatory Visit (HOSPITAL_COMMUNITY): Payer: Self-pay

## 2023-04-16 MED ORDER — NEOMYCIN-POLYMYXIN-DEXAMETH 3.5-10000-0.1 OP SUSP
2.0000 [drp] | Freq: Four times a day (QID) | OPHTHALMIC | 2 refills | Status: AC
  Filled 2023-04-16: qty 5, 7d supply, fill #0

## 2023-09-10 NOTE — Procedures (Signed)
Study scanned under media
